# Patient Record
Sex: Male | Born: 1945
Health system: Southern US, Community
[De-identification: ages and names within clinical notes are randomized; demographics above are authoritative.]

## PROBLEM LIST (undated history)

## (undated) DIAGNOSIS — F329 Major depressive disorder, single episode, unspecified: Secondary | ICD-10-CM

## (undated) DIAGNOSIS — F32A Depression, unspecified: Secondary | ICD-10-CM

## (undated) DIAGNOSIS — Z8673 Personal history of transient ischemic attack (TIA), and cerebral infarction without residual deficits: Secondary | ICD-10-CM

## (undated) DIAGNOSIS — Z8619 Personal history of other infectious and parasitic diseases: Secondary | ICD-10-CM

## (undated) DIAGNOSIS — I639 Cerebral infarction, unspecified: Secondary | ICD-10-CM

## (undated) DIAGNOSIS — N529 Male erectile dysfunction, unspecified: Secondary | ICD-10-CM

## (undated) HISTORY — PX: CARDIAC SURGERY: SHX584

## (undated) HISTORY — DX: Major depressive disorder, single episode, unspecified: F32.9

## (undated) HISTORY — PX: SHOULDER SURGERY: SHX246

## (undated) HISTORY — DX: Personal history of transient ischemic attack (TIA), and cerebral infarction without residual deficits: Z86.73

## (undated) HISTORY — DX: Personal history of other infectious and parasitic diseases: Z86.19

## (undated) HISTORY — DX: Cerebral infarction, unspecified: I63.9

## (undated) HISTORY — DX: Depression, unspecified: F32.A

## (undated) HISTORY — DX: Male erectile dysfunction, unspecified: N52.9

---

## 2001-03-02 HISTORY — PX: CORONARY ARTERY BYPASS GRAFT: SHX141

## 2005-09-06 DIAGNOSIS — Z8673 Personal history of transient ischemic attack (TIA), and cerebral infarction without residual deficits: Secondary | ICD-10-CM

## 2005-09-06 HISTORY — DX: Personal history of transient ischemic attack (TIA), and cerebral infarction without residual deficits: Z86.73

## 2008-01-05 HISTORY — PX: COLONOSCOPY: SHX174

## 2008-06-13 ENCOUNTER — Ambulatory Visit: Payer: Self-pay | Admitting: Unknown Physician Specialty

## 2008-07-03 LAB — HM COLONOSCOPY

## 2009-07-23 ENCOUNTER — Ambulatory Visit: Payer: Self-pay | Admitting: Specialist

## 2009-08-06 ENCOUNTER — Ambulatory Visit: Payer: Self-pay | Admitting: Specialist

## 2011-05-20 ENCOUNTER — Observation Stay: Payer: Self-pay | Admitting: Cardiology

## 2011-05-20 ENCOUNTER — Ambulatory Visit: Payer: Self-pay | Admitting: Cardiology

## 2013-08-10 ENCOUNTER — Ambulatory Visit: Payer: Self-pay | Admitting: Podiatry

## 2013-09-07 ENCOUNTER — Encounter: Payer: Self-pay | Admitting: Podiatry

## 2013-09-11 ENCOUNTER — Encounter: Payer: Self-pay | Admitting: Podiatry

## 2013-09-11 ENCOUNTER — Ambulatory Visit (INDEPENDENT_AMBULATORY_CARE_PROVIDER_SITE_OTHER): Payer: Medicare Other | Admitting: Podiatry

## 2013-09-11 VITALS — BP 154/95 | HR 62 | Resp 16 | Ht 68.0 in | Wt 188.0 lb

## 2013-09-11 DIAGNOSIS — L600 Ingrowing nail: Secondary | ICD-10-CM

## 2013-09-11 NOTE — Progress Notes (Signed)
Subjective:     Patient ID: Timothy Carson, male   DOB: 11-20-1945, 68 y.o.   MRN: [id number]  HPI patient presents with painful ingrown toenails of his big toes both feet that have been there for a long time with also damage noted to the nailbeds themselves secondary to golf and physical activity. States they have bothered him worse over the last 6 months   Review of Systems  All other systems reviewed and are negative.       Objective:   Physical Exam  Nursing note and vitals reviewed. Constitutional: He is oriented to person, place, and time. He appears well-nourished.  Cardiovascular: Intact distal pulses.   Musculoskeletal: Normal range of motion.  Neurological: He is oriented to person, place, and time.  Skin: Skin is warm.   neurovascular status intact with normal muscle strength and damaged big toenails both feet with incurvation of the lateral borders and pain when pressed.     Assessment:     Chronic ingrown toenail deformity hallux both feet with damage to the entire nailbeds of both feet    Plan:     H&P performed and recommendations for removal of the lateral corners with possibility long-term we may need to remove the entire nailbeds. Patient wants procedure understanding risks and today I infiltrated 60 mg Xylocaine Marcaine mixture remove the lateral borders exposed the matrix applied 3 applications of phenol 30 seconds followed by alcohol lavaged and sterile dressing. Instructed on soaks for patient and reappoint as needed

## 2013-09-11 NOTE — Patient Instructions (Signed)

## 2013-09-11 NOTE — Progress Notes (Signed)
   Subjective:    Patient ID: Timothy Carson, male    DOB: 1945-11-27, 68 y.o.   MRN: [id number]  HPI Comments: Ingrown toenails ,both great toenails lateral corners are painful , play golf and walk a lot and that seems to aggravate it , also touching the sheets at night seem to be painful .     Review of Systems  Musculoskeletal: Positive for back pain.  All other systems reviewed and are negative.       Objective:   Physical Exam        Assessment & Plan:

## 2014-01-07 LAB — PSA: PSA: 0.6

## 2014-05-28 DIAGNOSIS — I25810 Atherosclerosis of coronary artery bypass graft(s) without angina pectoris: Secondary | ICD-10-CM | POA: Insufficient documentation

## 2014-05-28 DIAGNOSIS — E785 Hyperlipidemia, unspecified: Secondary | ICD-10-CM | POA: Insufficient documentation

## 2014-11-11 DIAGNOSIS — I2510 Atherosclerotic heart disease of native coronary artery without angina pectoris: Secondary | ICD-10-CM | POA: Diagnosis not present

## 2014-11-11 DIAGNOSIS — I10 Essential (primary) hypertension: Secondary | ICD-10-CM | POA: Diagnosis not present

## 2014-11-11 DIAGNOSIS — E780 Pure hypercholesterolemia: Secondary | ICD-10-CM | POA: Diagnosis not present

## 2014-11-11 DIAGNOSIS — I25810 Atherosclerosis of coronary artery bypass graft(s) without angina pectoris: Secondary | ICD-10-CM | POA: Diagnosis not present

## 2014-11-14 DIAGNOSIS — M7652 Patellar tendinitis, left knee: Secondary | ICD-10-CM | POA: Diagnosis not present

## 2015-01-10 DIAGNOSIS — Z0000 Encounter for general adult medical examination without abnormal findings: Secondary | ICD-10-CM | POA: Diagnosis not present

## 2015-01-13 DIAGNOSIS — Z0000 Encounter for general adult medical examination without abnormal findings: Secondary | ICD-10-CM | POA: Diagnosis not present

## 2015-01-13 DIAGNOSIS — I2510 Atherosclerotic heart disease of native coronary artery without angina pectoris: Secondary | ICD-10-CM | POA: Diagnosis not present

## 2015-01-13 DIAGNOSIS — N529 Male erectile dysfunction, unspecified: Secondary | ICD-10-CM | POA: Diagnosis not present

## 2015-01-13 LAB — TSH: TSH: 5.26 u[IU]/mL (ref 0.41–5.90)

## 2015-01-13 LAB — BASIC METABOLIC PANEL
BUN: 14 mg/dL (ref 4–21)
Creatinine: 1 mg/dL (ref 0.6–1.3)
GLUCOSE: 135 mg/dL
Potassium: 4.3 mmol/L (ref 3.4–5.3)
Sodium: 144 mmol/L (ref 137–147)

## 2015-01-13 LAB — LIPID PANEL
Cholesterol: 123 mg/dL (ref 0–200)
HDL: 57 mg/dL (ref 35–70)
LDL CALC: 47 mg/dL
Triglycerides: 93 mg/dL (ref 40–160)

## 2015-06-18 ENCOUNTER — Other Ambulatory Visit (INDEPENDENT_AMBULATORY_CARE_PROVIDER_SITE_OTHER): Payer: Medicare Other | Admitting: Family Medicine

## 2015-06-18 ENCOUNTER — Ambulatory Visit (INDEPENDENT_AMBULATORY_CARE_PROVIDER_SITE_OTHER): Payer: Medicare Other

## 2015-06-18 DIAGNOSIS — Z23 Encounter for immunization: Secondary | ICD-10-CM | POA: Diagnosis not present

## 2015-11-18 DIAGNOSIS — I2510 Atherosclerotic heart disease of native coronary artery without angina pectoris: Secondary | ICD-10-CM | POA: Insufficient documentation

## 2015-11-18 DIAGNOSIS — F329 Major depressive disorder, single episode, unspecified: Secondary | ICD-10-CM | POA: Insufficient documentation

## 2015-11-18 DIAGNOSIS — Z8673 Personal history of transient ischemic attack (TIA), and cerebral infarction without residual deficits: Secondary | ICD-10-CM | POA: Insufficient documentation

## 2015-11-18 DIAGNOSIS — F32A Depression, unspecified: Secondary | ICD-10-CM | POA: Insufficient documentation

## 2015-11-18 DIAGNOSIS — N529 Male erectile dysfunction, unspecified: Secondary | ICD-10-CM | POA: Insufficient documentation

## 2015-11-18 DIAGNOSIS — Z8619 Personal history of other infectious and parasitic diseases: Secondary | ICD-10-CM | POA: Insufficient documentation

## 2015-11-19 ENCOUNTER — Encounter: Payer: Medicare Other | Admitting: Family Medicine

## 2015-11-20 ENCOUNTER — Ambulatory Visit (INDEPENDENT_AMBULATORY_CARE_PROVIDER_SITE_OTHER): Payer: Medicare Other | Admitting: Family Medicine

## 2015-11-20 ENCOUNTER — Encounter: Payer: Self-pay | Admitting: Family Medicine

## 2015-11-20 VITALS — BP 120/70 | HR 55 | Temp 97.6°F | Resp 16 | Ht 67.5 in | Wt 170.0 lb

## 2015-11-20 DIAGNOSIS — Z0000 Encounter for general adult medical examination without abnormal findings: Secondary | ICD-10-CM | POA: Diagnosis not present

## 2015-11-20 DIAGNOSIS — E785 Hyperlipidemia, unspecified: Secondary | ICD-10-CM

## 2015-11-20 DIAGNOSIS — I2510 Atherosclerotic heart disease of native coronary artery without angina pectoris: Secondary | ICD-10-CM

## 2015-11-20 DIAGNOSIS — Z125 Encounter for screening for malignant neoplasm of prostate: Secondary | ICD-10-CM

## 2015-11-20 DIAGNOSIS — R739 Hyperglycemia, unspecified: Secondary | ICD-10-CM | POA: Diagnosis not present

## 2015-11-20 NOTE — Progress Notes (Signed)
Patient: Timothy Carson, Male    DOB: Feb 20, 1946, 70 y.o.   MRN: [id number] Visit Date: 11/20/2015  Today's Provider: Lelon Huh, MD   Chief Complaint  Patient presents with  . Annual Exam  . Coronary Artery Disease  . Hyperglycemia   Subjective:    Annual physical  Timothy Carson is a 70 y.o. male. He feels well. He reports exercising yes/walking. He reports he is sleeping fairly well.  -----------------------------------------------------------   Follow-up for CAD from 01/10/2015; labs, no changes. Follow-up for hyperglycemia from 01/10/2015; labs showed high blood sugar at 135. Patient advised to cut back on sweets and starchy foods. Also advised to exercise daily. He feels well with no complaints today. Exercises every day. On low salt diet. Compliant with medications.   Review of Systems  Constitutional: Negative.   HENT: Positive for tinnitus.   Eyes: Negative.   Respiratory: Negative.   Cardiovascular: Positive for chest pain.  Gastrointestinal: Positive for constipation.  Endocrine: Negative.   Genitourinary: Negative.   Musculoskeletal: Negative.   Skin: Negative.   Allergic/Immunologic: Negative.   Neurological: Negative.   Hematological: Negative.   Psychiatric/Behavioral: Negative.     Social History   Social History  . Marital Status: Married    Spouse Name: N/A  . Number of Children: N/A  . Years of Education: N/A   Occupational History  . Retired    Social History Main Topics  . Smoking status: Never Smoker   . Smokeless tobacco: Never Used  . Alcohol Use: No  . Drug Use: No  . Sexual Activity: Not on file   Other Topics Concern  . Not on file   Social History Narrative    Past Medical History  Diagnosis Date  . History of TIA (transient ischemic attack)   . Depression   . ED (erectile dysfunction)   . History of shingles   . History of measles   . History of mumps      Patient Active Problem List   Diagnosis Date Noted    . History of TIA (transient ischemic attack) 11/18/2015  . Depression 11/18/2015  . CAD (coronary artery disease) 11/18/2015  . ED (erectile dysfunction) of organic origin 11/18/2015  . HLD (hyperlipidemia) 05/28/2014    Past Surgical History  Procedure Laterality Date  . Cardiac surgery    . Shoulder surgery Bilateral   . Coronary artery bypass graft  03/02/2001    Two:  LIMA [facility] LAD to [facility]  . Colonoscopy  01/2008    No polyps; Diverticulosis    His family history includes Heart disease in his mother.    Previous Medications   ASPIRIN 81 MG TABLET    Take 81 mg by mouth daily.   EZETIMIBE-SIMVASTATIN (VYTORIN) 10-20 MG PER TABLET    Take 1 tablet by mouth daily.   SILDENAFIL (VIAGRA) 100 MG TABLET    Take 0.5-1 tablets by mouth as needed.    Patient Care Team: Birdie Sons, MD as PCP - General (Family Medicine) Isaias Cowman, MD as Consulting Physician (Cardiology)     Objective:   Vitals: BP 120/70 mmHg  Pulse 55  Temp(Src) 97.6 F (36.4 C) (Oral)  Resp 16  Ht 5' 7.5" (1.715 m)  Wt 170 lb (77.111 kg)  BMI 26.22 kg/m2  SpO2 100%  Physical Exam   General Appearance:    Alert, cooperative, no distress, appears stated age  Head:    Normocephalic, without obvious abnormality, atraumatic  Eyes:    PERRL, conjunctiva/corneas clear, EOM's intact, fundi    benign, both eyes       Ears:    Normal TM's and external ear canals, both ears  Nose:   Nares normal, septum midline, mucosa normal, no drainage   or sinus tenderness  Throat:   Lips, mucosa, and tongue normal; teeth and gums normal  Neck:   Supple, symmetrical, trachea midline, no adenopathy;       thyroid:  No enlargement/tenderness/nodules; no carotid   bruit or JVD  Back:     Symmetric, no curvature, ROM normal, no CVA tenderness  Lungs:     Clear to auscultation bilaterally, respirations unlabored  Chest wall:    No tenderness or deformity. Well healed CABG scar  Heart:    Regular rate and rhythm,  S1 and S2 normal, no murmur, rub   or gallop  Abdomen:     Soft, non-tender, bowel sounds active all four quadrants,    no masses, no organomegaly  Genitalia:    deferred  Rectal:    deferred  Extremities:   Extremities normal, atraumatic, no cyanosis or edema  Pulses:   2+ and symmetric all extremities  Skin:   Skin color, texture, turgor normal, no rashes or lesions  Lymph nodes:   Cervical, supraclavicular, and axillary nodes normal  Neurologic:   CNII-XII intact. Normal strength, sensation and reflexes      throughout    Activities of Daily Living In your present state of health, do you have any difficulty performing the following activities: 11/20/2015  Hearing? N  Vision? N  Difficulty concentrating or making decisions? N  Walking or climbing stairs? N  Dressing or bathing? N  Doing errands, shopping? N    Fall Risk Assessment Fall Risk  11/20/2015  Falls in the past year? No     Depression Screen PHQ 2/9 Scores 11/20/2015  PHQ - 2 Score 0    Cognitive Testing - 6-CIT  Correct? Score   What year is it? yes 0 0 or 4  What month is it? yes 0 0 or 3  Memorize:    Timothy Carson,  [birth year],  [place],      What time is it? (within 1 hour) yes 0 0 or 3  Count backwards from 20 yes 0 0, 2, or 4  Name the months of the year yes 0 0, 2, or 4  Repeat name & address above yes 3 0, 2, 4, 6, 8, or 10       TOTAL SCORE  3/28   Interpretation:  Normal  Normal (0-7) Abnormal (8-28)    Audit-C Alcohol Use Screening  Question Answer Points  How often do you have alcoholic drink? never 0  On days you do drink alcohol, how many drinks do you typically consume? 1 or 2 0  How oftey will you drink 6 or more in a total? never 0  Total Score:  0   A score of 3 or more in women, and 4 or more in men indicates increased risk for alcohol abuse, EXCEPT if all of the points are from question 1.      Assessment & Plan:     Annual Wellness Visit  Reviewed patient's Family  Medical History Reviewed and updated list of patient's medical providers Assessment of cognitive impairment was done Assessed patient's functional ability Established a written schedule for health screening [place] Completed and Reviewed  Exercise Activities  and Dietary recommendations Goals    None      Immunization History  Administered Date(s) Administered  . Influenza, High Dose Seasonal PF 06/18/2015  . Pneumococcal Conjugate-13 06/18/2015  . Pneumococcal Polysaccharide-23 05/30/2013  . Tdap 08/17/2011  . Zoster 05/30/2013    Health Maintenance  Topic Date Due  . Hepatitis C Screening  03-21-1946  . COLONOSCOPY  11/27/1996  . INFLUENZA VACCINE  04/06/2016  . TETANUS/TDAP  08/16/2021  . ZOSTAVAX  Completed  . PNA vac Low Risk Adult  Completed      Discussed health benefits of physical activity, and encouraged him to engage in regular exercise appropriate for his age and condition.    --------------------------------------------------------------------------------  1. Annual physical exam Doing well  - Comprehensive metabolic panel  2. Coronary artery disease involving native coronary artery of native heart without angina pectoris Asymptomatic. Compliant with medication.  Continue aggressive risk factor modification.  Continue biannual follow up with Dr. Saralyn Pilar  3. HLD (hyperlipidemia) Doing well with Vytorin - Lipid panel  4. Hyperglycemia  - Hemoglobin A1c - Comprehensive metabolic panel  5. Prostate cancer screening  - PSA

## 2015-11-20 NOTE — Patient Instructions (Signed)
   Please contact your eyecare professional to schedule a routine eye exam  

## 2015-11-21 ENCOUNTER — Telehealth: Payer: Self-pay | Admitting: Family Medicine

## 2015-11-21 LAB — COMPREHENSIVE METABOLIC PANEL
A/G RATIO: 1.7 (ref 1.2–2.2)
ALK PHOS: 59 IU/L (ref 39–117)
ALT: 20 IU/L (ref 0–44)
AST: 23 IU/L (ref 0–40)
Albumin: 4.2 g/dL (ref 3.6–4.8)
BUN / CREAT RATIO: 13 (ref 10–22)
BUN: 13 mg/dL (ref 8–27)
Bilirubin Total: 1 mg/dL (ref 0.0–1.2)
CO2: 23 mmol/L (ref 18–29)
Calcium: 9.6 mg/dL (ref 8.6–10.2)
Chloride: 107 mmol/L — ABNORMAL HIGH (ref 96–106)
Creatinine, Ser: 0.98 mg/dL (ref 0.76–1.27)
GFR calc Af Amer: 91 mL/min/{1.73_m2} (ref >59)
GFR calc non Af Amer: 79 mL/min/{1.73_m2} (ref >59)
GLOBULIN, TOTAL: 2.5 g/dL (ref 1.5–4.5)
Glucose: 116 mg/dL — ABNORMAL HIGH (ref 65–99)
POTASSIUM: 4.9 mmol/L (ref 3.5–5.2)
SODIUM: 146 mmol/L — AB (ref 134–144)
Total Protein: 6.7 g/dL (ref 6.0–8.5)

## 2015-11-21 LAB — HEMOGLOBIN A1C
Est. average glucose Bld gHb Est-mCnc: 123 mg/dL
Hgb A1c MFr Bld: 5.9 % — ABNORMAL HIGH (ref 4.8–5.6)

## 2015-11-21 LAB — LIPID PANEL
CHOLESTEROL TOTAL: 122 mg/dL (ref 100–199)
Chol/HDL Ratio: 2.1 ratio units (ref 0.0–5.0)
HDL: 59 mg/dL (ref >39)
LDL CALC: 47 mg/dL (ref 0–99)
TRIGLYCERIDES: 78 mg/dL (ref 0–149)
VLDL CHOLESTEROL CAL: 16 mg/dL (ref 5–40)

## 2015-11-21 LAB — PSA: Prostate Specific Ag, Serum: 0.7 ng/mL (ref 0.0–4.0)

## 2015-11-21 NOTE — Telephone Encounter (Signed)
Pt is returning call.  [phone]

## 2015-11-21 NOTE — Telephone Encounter (Signed)
Pt notified of results

## 2015-11-21 NOTE — Progress Notes (Signed)
LMTCB

## 2016-01-06 DIAGNOSIS — I2510 Atherosclerotic heart disease of native coronary artery without angina pectoris: Secondary | ICD-10-CM | POA: Diagnosis not present

## 2016-01-06 DIAGNOSIS — Z8673 Personal history of transient ischemic attack (TIA), and cerebral infarction without residual deficits: Secondary | ICD-10-CM | POA: Diagnosis not present

## 2016-01-06 DIAGNOSIS — I25810 Atherosclerosis of coronary artery bypass graft(s) without angina pectoris: Secondary | ICD-10-CM | POA: Diagnosis not present

## 2016-01-06 DIAGNOSIS — I10 Essential (primary) hypertension: Secondary | ICD-10-CM | POA: Diagnosis not present

## 2016-01-06 DIAGNOSIS — E7800 Pure hypercholesterolemia, unspecified: Secondary | ICD-10-CM | POA: Diagnosis not present

## 2016-03-06 DIAGNOSIS — I639 Cerebral infarction, unspecified: Secondary | ICD-10-CM

## 2016-03-06 HISTORY — DX: Cerebral infarction, unspecified: I63.9

## 2016-03-24 DIAGNOSIS — I781 Nevus, non-neoplastic: Secondary | ICD-10-CM | POA: Diagnosis not present

## 2016-03-24 DIAGNOSIS — L821 Other seborrheic keratosis: Secondary | ICD-10-CM | POA: Diagnosis not present

## 2016-03-24 DIAGNOSIS — L570 Actinic keratosis: Secondary | ICD-10-CM | POA: Diagnosis not present

## 2016-05-06 ENCOUNTER — Telehealth: Payer: Self-pay | Admitting: Family Medicine

## 2016-05-06 NOTE — Telephone Encounter (Signed)
Pt needs a letter stating his health is good enough to participate in the activities that they offer at the [facility]  Hiking, Kayaking ect.  He will come pick it up when it is ready.  Thanks Con Memos

## 2016-05-07 ENCOUNTER — Encounter: Payer: Self-pay | Admitting: Family Medicine

## 2016-05-07 NOTE — Progress Notes (Signed)
[facility]

## 2016-05-07 NOTE — Telephone Encounter (Signed)
Patient notified letter is ready to pick up.

## 2016-06-24 ENCOUNTER — Ambulatory Visit (INDEPENDENT_AMBULATORY_CARE_PROVIDER_SITE_OTHER): Payer: Medicare Other

## 2016-06-24 DIAGNOSIS — Z23 Encounter for immunization: Secondary | ICD-10-CM

## 2016-07-16 ENCOUNTER — Telehealth: Payer: Self-pay | Admitting: Family Medicine

## 2016-07-16 NOTE — Telephone Encounter (Signed)
Pt returned call and stated that he will call back in 2018 to schedule the appt. Thanks TNP

## 2016-07-16 NOTE — Telephone Encounter (Signed)
Called Pt to schedule AWV with [facility] and CPE with Dr. Caryn Section for March 2018 - knb

## 2016-08-18 DIAGNOSIS — I10 Essential (primary) hypertension: Secondary | ICD-10-CM | POA: Diagnosis not present

## 2016-08-18 DIAGNOSIS — E7800 Pure hypercholesterolemia, unspecified: Secondary | ICD-10-CM | POA: Diagnosis not present

## 2016-08-18 DIAGNOSIS — I25810 Atherosclerosis of coronary artery bypass graft(s) without angina pectoris: Secondary | ICD-10-CM | POA: Diagnosis not present

## 2016-08-18 DIAGNOSIS — Z8673 Personal history of transient ischemic attack (TIA), and cerebral infarction without residual deficits: Secondary | ICD-10-CM | POA: Diagnosis not present

## 2016-08-18 DIAGNOSIS — I2510 Atherosclerotic heart disease of native coronary artery without angina pectoris: Secondary | ICD-10-CM | POA: Diagnosis not present

## 2016-08-18 DIAGNOSIS — R739 Hyperglycemia, unspecified: Secondary | ICD-10-CM | POA: Diagnosis not present

## 2016-08-23 DIAGNOSIS — I441 Atrioventricular block, second degree: Secondary | ICD-10-CM | POA: Diagnosis not present

## 2016-09-15 DIAGNOSIS — I2510 Atherosclerotic heart disease of native coronary artery without angina pectoris: Secondary | ICD-10-CM | POA: Diagnosis not present

## 2016-09-15 DIAGNOSIS — I25810 Atherosclerosis of coronary artery bypass graft(s) without angina pectoris: Secondary | ICD-10-CM | POA: Diagnosis not present

## 2016-09-15 DIAGNOSIS — Z8673 Personal history of transient ischemic attack (TIA), and cerebral infarction without residual deficits: Secondary | ICD-10-CM | POA: Diagnosis not present

## 2016-09-15 DIAGNOSIS — I6523 Occlusion and stenosis of bilateral carotid arteries: Secondary | ICD-10-CM | POA: Diagnosis not present

## 2016-09-16 ENCOUNTER — Ambulatory Visit (INDEPENDENT_AMBULATORY_CARE_PROVIDER_SITE_OTHER): Payer: Medicare Other | Admitting: Podiatry

## 2016-09-16 ENCOUNTER — Encounter: Payer: Self-pay | Admitting: Podiatry

## 2016-09-16 DIAGNOSIS — M792 Neuralgia and neuritis, unspecified: Secondary | ICD-10-CM | POA: Diagnosis not present

## 2016-09-16 DIAGNOSIS — B351 Tinea unguium: Secondary | ICD-10-CM

## 2016-09-16 DIAGNOSIS — L608 Other nail disorders: Secondary | ICD-10-CM | POA: Diagnosis not present

## 2016-09-16 DIAGNOSIS — L603 Nail dystrophy: Secondary | ICD-10-CM

## 2016-09-16 NOTE — Progress Notes (Signed)
Subjective: 71 year old male presents the office today for concerns of his right big toenail becoming very thick after he had an ingrown toenail procedure done 2 years ago. He states that several weeks after procedure history is more thickening to the toenail. He's had no treatment of the last couple years and he is just on the toenail grow. He denies any pain with the toenail denies any redness or drainage or any swelling. He also states that on the right second toe he does that he started to get a toenail fungus to the nails well. Again denies any pain or swelling. He also states that his left foot he gets some occasional numbness to the top of his foot when he wears certain shoes as his compressing on the top of the foot. He is in no recent treatment for this. It is only with walking and wearing shoes. No recent injury. Denies any systemic complaints such as fevers, chills, nausea, vomiting. No acute changes since last appointment, and no other complaints at this time.   ROS:  All other systems reviewed and negative   Objective: AAO x3, NAD DP/PT pulses palpable bilaterally, CRT less than 3 seconds Right hallux toenail significantly hypertrophic, dystrophic, brittle, discolored with yellow to brown discoloration. The left hallux toenail somewhat dystrophic, discolored and minimally hypertrophic. There is slight yellow discoloration of the right second digit toenail. There is no edema, erythema, drainage or pus coming from the toenails there is no signs of infection. There is no area pinpoint tenderness or pain the vibratory sensation. There is negative Tinel sign and superficial peroneal nerves into the nerves the left foot.  No open lesions or pre-ulcerative lesions.  No pain with calf compression, swelling, warmth, erythema  Assessment: Left foot neuritis, right hallux onychodystrophy likely onychomycosis as well as right 2nd  Plan: -All treatment options discussed with the patient including  all alternatives, risks, complications.  -I discussed releasing the left to help the nerve pain. Discussed that as this goes on longer can be more permanent. If replacement she is not help recommend follow-up. -Right hallux toenail  was debrided today without complications and this was sent to [facility] for  evaluation of nail fungus. -Recommended Urea cream for now we will likely start nail fungus treatment pending culture. Once we start this he can do this on the other toenails as well due to possible early onychomycosis. -Follow-up after nail culture or sooner if needed.  Celesta Gentile, DPM

## 2016-09-16 NOTE — Addendum Note (Signed)
Addended by: Cranford Mon R on: 09/16/2016 09:23 AM   Modules accepted: Orders

## 2016-09-21 DIAGNOSIS — I10 Essential (primary) hypertension: Secondary | ICD-10-CM | POA: Diagnosis not present

## 2016-09-21 DIAGNOSIS — Z8673 Personal history of transient ischemic attack (TIA), and cerebral infarction without residual deficits: Secondary | ICD-10-CM | POA: Diagnosis not present

## 2016-09-21 DIAGNOSIS — E7800 Pure hypercholesterolemia, unspecified: Secondary | ICD-10-CM | POA: Diagnosis not present

## 2016-09-21 DIAGNOSIS — I25810 Atherosclerosis of coronary artery bypass graft(s) without angina pectoris: Secondary | ICD-10-CM | POA: Diagnosis not present

## 2016-09-27 ENCOUNTER — Encounter: Payer: Self-pay | Admitting: Family Medicine

## 2016-09-27 ENCOUNTER — Ambulatory Visit (INDEPENDENT_AMBULATORY_CARE_PROVIDER_SITE_OTHER): Payer: Medicare Other | Admitting: Family Medicine

## 2016-09-27 VITALS — BP 120/78 | HR 57 | Temp 98.7°F | Resp 16 | Ht 68.0 in | Wt 164.0 lb

## 2016-09-27 DIAGNOSIS — E785 Hyperlipidemia, unspecified: Secondary | ICD-10-CM

## 2016-09-27 DIAGNOSIS — Z8673 Personal history of transient ischemic attack (TIA), and cerebral infarction without residual deficits: Secondary | ICD-10-CM

## 2016-09-27 DIAGNOSIS — Z1159 Encounter for screening for other viral diseases: Secondary | ICD-10-CM | POA: Diagnosis not present

## 2016-09-27 DIAGNOSIS — I2510 Atherosclerotic heart disease of native coronary artery without angina pectoris: Secondary | ICD-10-CM | POA: Diagnosis not present

## 2016-09-27 DIAGNOSIS — Z0000 Encounter for general adult medical examination without abnormal findings: Secondary | ICD-10-CM | POA: Diagnosis not present

## 2016-09-27 NOTE — Progress Notes (Signed)
Patient: Timothy Carson, Male    DOB: 1946-03-10, 71 y.o.   MRN: [id number] Visit Date: 09/27/2016  Today's Provider: Lelon Huh, MD   Chief Complaint  Patient presents with  . Annual Exam  . Hyperlipidemia  . Hyperglycemia  . Coronary Artery Disease   Subjective:    Annual physical Timothy Carson is a 71 y.o. male. He feels well. He reports exercising yes. He reports he is sleeping well.  -----------------------------------------------------------   Coronary artery disease involving native coronary artery of native heart without angina pectoris From 11/20/2015-Asymptomatic. Continue biannual follow up with Dr. Saralyn Carson.  Hyperglycemia From 11/20/2015-labs checked, no changes   Lipid/Cholesterol, Follow-up:   Last seen for this 10 months ago.  Management since that visit includes; labs checked, recommended patient avoid sweats and starchy foods.  Last Lipid Panel:    Component Value Date/Time   CHOL 122 11/20/2015 0955   TRIG 78 11/20/2015 0955   HDL 59 11/20/2015 0955   CHOLHDL 2.1 11/20/2015 0955   LDLCALC 47 11/20/2015 0955    He reports good compliance with treatment. He is not having side effects. none  Wt Readings from Last 3 Encounters:  09/27/16 164 lb (74.4 kg)  11/20/15 170 lb (77.1 kg)  09/11/13 188 lb (85.3 kg)    ----------------------------------------------------------------  States he had a stroke in July causing some trouble speaking and weak legs. Symptoms last a few weeks and have since completely resolved. He had routine follow up with Timothy Carson a few weeks ago and had normal carotid ultrasound.   Review of Systems  Constitutional: Negative.   HENT: Positive for tinnitus.   Eyes: Negative.   Respiratory: Negative.   Cardiovascular: Negative.   Gastrointestinal: Positive for constipation.  Endocrine: Negative.   Genitourinary: Positive for urgency.  Musculoskeletal:       Foot pain (top of left foot)  Skin: Negative.     Allergic/Immunologic: Negative.   Neurological: Positive for dizziness.  Hematological: Negative.   Psychiatric/Behavioral: Negative.     Social History   Social History  . Marital status: Married    Spouse name: N/A  . Number of children: N/A  . Years of education: N/A   Occupational History  . Retired    Social History Main Topics  . Smoking status: Never Smoker  . Smokeless tobacco: Never Used  . Alcohol use No  . Drug use: No  . Sexual activity: Not on file   Other Topics Concern  . Not on file   Social History Narrative  . No narrative on file    Past Medical History:  Diagnosis Date  . Depression   . ED (erectile dysfunction)   . History of measles   . History of mumps   . History of shingles   . History of TIA (transient ischemic attack) 2007 and 7/17     Patient Active Problem List   Diagnosis Date Noted  . Hyperglycemia 11/20/2015  . History of TIA (transient ischemic attack) 11/18/2015  . CAD (coronary artery disease) 11/18/2015  . ED (erectile dysfunction) of organic origin 11/18/2015  . HLD (hyperlipidemia) 05/28/2014    Past Surgical History:  Procedure Laterality Date  . CARDIAC SURGERY    . COLONOSCOPY  01/2008   No polyps; Diverticulosis  . CORONARY ARTERY BYPASS GRAFT  03/02/2001   Two:  LIMA to LAD to [facility]  . SHOULDER SURGERY Bilateral     His family history includes Heart disease in his mother.  Current Outpatient Prescriptions:  .  aspirin 81 MG tablet, Take 81 mg by mouth daily., Disp: , Rfl:  .  ezetimibe-simvastatin (VYTORIN) 10-20 MG per tablet, Take 1 tablet by mouth daily., Disp: , Rfl:  .  sildenafil (VIAGRA) 100 MG tablet, Take 0.5-1 tablets by mouth as needed., Disp: , Rfl:   Patient Care Team: Timothy Sons, MD as PCP - General (Family Medicine) Timothy Cowman, MD as Consulting Physician (Cardiology)     Objective:   Vitals: BP 120/78 (BP Location: Left Arm, Patient Position: Sitting, Cuff Size:  Large)   Pulse (!) 57   Temp 98.7 F (37.1 C) (Oral)   Resp 16   Ht 5\' 8"  (1.727 m)   Wt 164 lb (74.4 kg)   SpO2 97%   BMI 24.94 kg/m   Physical Exam   General Appearance:    Alert, cooperative, no distress, appears stated age  Head:    Normocephalic, without obvious abnormality, atraumatic  Eyes:    PERRL, conjunctiva/corneas clear, EOM's intact, fundi    benign, both eyes       Ears:    Normal TM's and external ear canals, both ears  Nose:   Nares normal, septum midline, mucosa normal, no drainage   or sinus tenderness  Throat:   Lips, mucosa, and tongue normal; teeth and gums normal  Neck:   Supple, symmetrical, trachea midline, no adenopathy;       thyroid:  No enlargement/tenderness/nodules; no carotid   bruit or JVD  Back:     Symmetric, no curvature, ROM normal, no CVA tenderness  Lungs:     Clear to auscultation bilaterally, respirations unlabored  Chest wall:    No tenderness or deformity  Heart:    Regular rate and rhythm, S1 and S2 normal, no murmur, rub   or gallop  Abdomen:     Soft, non-tender, bowel sounds active all four quadrants,    no masses, no organomegaly  Genitalia:    deferred  Rectal:    deferred  Extremities:   Extremities normal, atraumatic, no cyanosis or edema  Pulses:   2+ and symmetric all extremities  Skin:   Skin color, texture, turgor normal, no rashes or lesions  Lymph nodes:   Cervical, supraclavicular, and axillary nodes normal  Neurologic:   CNII-XII intact. Normal strength, sensation and reflexes      throughout     Activities of Daily Living In your present state of health, do you have any difficulty performing the following activities: 09/27/2016 11/20/2015  Hearing? N N  Vision? N N  Difficulty concentrating or making decisions? N N  Walking or climbing stairs? N N  Dressing or bathing? N N  Doing errands, shopping? N N  Some recent data might be hidden    Fall Risk Assessment Fall Risk  09/27/2016 11/20/2015  Falls in the  past year? No No     Depression Screen PHQ 2/9 Scores 09/27/2016 11/20/2015  PHQ - 2 Score 0 0    Cognitive Testing - 6-CIT  Correct? Score   What year is it? yes 0 0 or 4  What month is it? yes 0 0 or 3  Memorize:    Timothy Carson,  [birth year],  Timothy Carson,      What time is it? (within 1 hour) yes 0 0 or 3  Count backwards from 20 yes 0 0, 2, or 4  Name the months of the year yes 0 0, 2, or 4  Repeat name & address above yes 0 0, 2, 4, 6, 8, or 10       TOTAL SCORE  0/28   Interpretation:  Normal  Normal (0-7) Abnormal (8-28)    Audit-C Alcohol Use Screening  Question Answer Points  How often do you have alcoholic drink? never 0  On days you do drink alcohol, how many drinks do you typically consume? n/a 0  How oftey will you drink 6 or more in a total? never 0  Total Score:  0   A score of 3 or more in women, and 4 or more in men indicates increased risk for alcohol abuse, EXCEPT if all of the points are from question 1.     Assessment & Plan:    Annual Physical Reviewed patient's Family Medical History Reviewed and updated list of patient's medical providers Assessment of cognitive impairment was done Assessed patient's functional ability Established a written schedule for health screening [place] Completed and Reviewed  Exercise Activities and Dietary recommendations Goals    None      Immunization History  Administered Date(s) Administered  . Influenza, High Dose Seasonal PF 06/18/2015, 06/24/2016  . Pneumococcal Conjugate-13 06/18/2015  . Pneumococcal Polysaccharide-23 05/30/2013  . Tdap 08/17/2011  . Zoster 05/30/2013    Health Maintenance  Topic Date Due  . Hepatitis C Screening  [birth year]  . COLONOSCOPY  11/27/1996  . TETANUS/TDAP  08/16/2021  . INFLUENZA VACCINE  Completed  . ZOSTAVAX  Completed  . PNA vac Low Risk Adult  Completed     Discussed health benefits of physical activity, and encouraged him to engage in  regular exercise appropriate for his age and condition.    --------------------------------------------------------------------------  1. Annual physical exam   2. Hyperlipidemia, unspecified hyperlipidemia type Doing well on Vytorin - Lipid panel - Hepatic function panel - TSH  3. TIA/CVA Is scheduled to see Dr. Manuella Ghazi for follow up in near future.   4. Coronary artery disease involving native coronary artery of native heart without angina pectoris Asymptomatic. Compliant with medication.  Continue aggressive risk factor modification.  Continue routine follow up Dr. Saralyn Carson.  - Renal function panel  5. Need for hepatitis C screening test  - Hepatitis C antibody   The entirety of the information documented in the History of Present Illness, Review of Systems and Physical Exam were personally obtained by me. Portions of this information were initially documented by April M. Sabra Heck, CMA and reviewed by me for thoroughness and accuracy.    Timothy Huh, MD  [facility]

## 2016-09-28 DIAGNOSIS — Z1159 Encounter for screening for other viral diseases: Secondary | ICD-10-CM | POA: Diagnosis not present

## 2016-09-28 DIAGNOSIS — I2510 Atherosclerotic heart disease of native coronary artery without angina pectoris: Secondary | ICD-10-CM | POA: Diagnosis not present

## 2016-09-28 DIAGNOSIS — E785 Hyperlipidemia, unspecified: Secondary | ICD-10-CM | POA: Diagnosis not present

## 2016-09-29 ENCOUNTER — Telehealth: Payer: Self-pay

## 2016-09-29 DIAGNOSIS — R471 Dysarthria and anarthria: Secondary | ICD-10-CM | POA: Diagnosis not present

## 2016-09-29 DIAGNOSIS — Z8673 Personal history of transient ischemic attack (TIA), and cerebral infarction without residual deficits: Secondary | ICD-10-CM | POA: Diagnosis not present

## 2016-09-29 LAB — RENAL FUNCTION PANEL
ALBUMIN: 4.2 g/dL (ref 3.6–4.8)
BUN/Creatinine Ratio: 14 (ref 10–24)
BUN: 14 mg/dL (ref 8–27)
CHLORIDE: 106 mmol/L (ref 96–106)
CO2: 24 mmol/L (ref 18–29)
Calcium: 9.2 mg/dL (ref 8.6–10.2)
Creatinine, Ser: 1.03 mg/dL (ref 0.76–1.27)
GFR calc non Af Amer: 74 mL/min/{1.73_m2} (ref >59)
GFR, EST AFRICAN AMERICAN: 85 mL/min/{1.73_m2} (ref >59)
GLUCOSE: 117 mg/dL — AB (ref 65–99)
PHOSPHORUS: 2.7 mg/dL (ref 2.5–4.5)
POTASSIUM: 4.2 mmol/L (ref 3.5–5.2)
Sodium: 143 mmol/L (ref 134–144)

## 2016-09-29 LAB — TSH: TSH: 8.03 u[IU]/mL — ABNORMAL HIGH (ref 0.450–4.500)

## 2016-09-29 LAB — HEPATITIS C ANTIBODY

## 2016-09-29 LAB — LIPID PANEL
CHOLESTEROL TOTAL: 131 mg/dL (ref 100–199)
Chol/HDL Ratio: 2.3 ratio units (ref 0.0–5.0)
HDL: 57 mg/dL (ref >39)
LDL Calculated: 57 mg/dL (ref 0–99)
TRIGLYCERIDES: 85 mg/dL (ref 0–149)
VLDL CHOLESTEROL CAL: 17 mg/dL (ref 5–40)

## 2016-09-29 LAB — HEPATIC FUNCTION PANEL
ALT: 32 IU/L (ref 0–44)
AST: 25 IU/L (ref 0–40)
Alkaline Phosphatase: 68 IU/L (ref 39–117)
Bilirubin Total: 1 mg/dL (ref 0.0–1.2)
Bilirubin, Direct: 0.25 mg/dL (ref 0.00–0.40)
TOTAL PROTEIN: 6.8 g/dL (ref 6.0–8.5)

## 2016-09-29 NOTE — Telephone Encounter (Signed)
Advised patient of results.  

## 2016-09-29 NOTE — Telephone Encounter (Signed)
Left message to call back  

## 2016-09-29 NOTE — Telephone Encounter (Signed)
-----   Message from Birdie Sons, MD sent at 09/29/2016  8:08 AM EST ----- Cholesterol is well controlled is a little hyPOthyroid. Thyroid levels can fluctuate. Recommend recheck T4 and TSH in a month to see if he may need thyroid medications. Rest of labs are good.

## 2016-09-30 ENCOUNTER — Other Ambulatory Visit: Payer: Self-pay | Admitting: Neurology

## 2016-09-30 DIAGNOSIS — R471 Dysarthria and anarthria: Secondary | ICD-10-CM

## 2016-10-04 ENCOUNTER — Telehealth: Payer: Self-pay | Admitting: *Deleted

## 2016-10-04 MED ORDER — NONFORMULARY OR COMPOUNDED ITEM: 2 refills | Status: DC

## 2016-10-04 NOTE — Telephone Encounter (Addendum)
-----   Message from Trula Slade, DPM sent at 09/29/2016  7:42 PM EST ----- Negative for fungus. However if he still would like treatment we can try the topical antifungal though [facility]. 10/04/2016-Informed pt of Dr.Wagoner's recommendation and he would like to try the topical. Orders for Onychomycosis Nail Lacquer faxed to [facility]

## 2016-10-08 ENCOUNTER — Ambulatory Visit
Admission: RE | Admit: 2016-10-08 | Discharge: 2016-10-08 | Disposition: A | Discharge status: Home / Self Care | Payer: Medicare Other | Source: Ambulatory Visit | Attending: Neurology | Admitting: Neurology

## 2016-10-08 DIAGNOSIS — Z8673 Personal history of transient ischemic attack (TIA), and cerebral infarction without residual deficits: Secondary | ICD-10-CM | POA: Insufficient documentation

## 2016-10-08 DIAGNOSIS — R471 Dysarthria and anarthria: Secondary | ICD-10-CM | POA: Diagnosis not present

## 2016-10-22 DIAGNOSIS — R471 Dysarthria and anarthria: Secondary | ICD-10-CM | POA: Diagnosis not present

## 2016-10-22 DIAGNOSIS — Z8673 Personal history of transient ischemic attack (TIA), and cerebral infarction without residual deficits: Secondary | ICD-10-CM | POA: Diagnosis not present

## 2016-11-02 ENCOUNTER — Telehealth: Payer: Self-pay | Admitting: Family Medicine

## 2016-11-02 DIAGNOSIS — E039 Hypothyroidism, unspecified: Secondary | ICD-10-CM | POA: Insufficient documentation

## 2016-11-02 DIAGNOSIS — E038 Other specified hypothyroidism: Secondary | ICD-10-CM

## 2016-11-02 NOTE — Telephone Encounter (Signed)
Tried calling patient. Left message to call back. 

## 2016-11-02 NOTE — Telephone Encounter (Signed)
Returning call, unable to reach [place].  Please call back.

## 2016-11-02 NOTE — Telephone Encounter (Signed)
Please advise patient it is time to check thyroid functions. Please print order and leave at front desk for patient to pick up.

## 2016-11-02 NOTE — Telephone Encounter (Signed)
Patient advised and agrees to have labs done. Lab slip printed and left up front for pick up.

## 2016-11-05 DIAGNOSIS — E039 Hypothyroidism, unspecified: Secondary | ICD-10-CM | POA: Diagnosis not present

## 2016-11-06 LAB — T4 AND TSH
T4 TOTAL: 7.5 ug/dL (ref 4.5–12.0)
TSH: 5.85 u[IU]/mL — AB (ref 0.450–4.500)

## 2016-12-22 DIAGNOSIS — E7800 Pure hypercholesterolemia, unspecified: Secondary | ICD-10-CM | POA: Diagnosis not present

## 2016-12-22 DIAGNOSIS — I10 Essential (primary) hypertension: Secondary | ICD-10-CM | POA: Diagnosis not present

## 2016-12-22 DIAGNOSIS — I25810 Atherosclerosis of coronary artery bypass graft(s) without angina pectoris: Secondary | ICD-10-CM | POA: Diagnosis not present

## 2017-01-05 DIAGNOSIS — Z8673 Personal history of transient ischemic attack (TIA), and cerebral infarction without residual deficits: Secondary | ICD-10-CM | POA: Diagnosis not present

## 2017-01-05 DIAGNOSIS — R413 Other amnesia: Secondary | ICD-10-CM | POA: Diagnosis not present

## 2017-03-29 DIAGNOSIS — L821 Other seborrheic keratosis: Secondary | ICD-10-CM | POA: Diagnosis not present

## 2017-03-29 DIAGNOSIS — C44619 Basal cell carcinoma of skin of left upper limb, including shoulder: Secondary | ICD-10-CM | POA: Diagnosis not present

## 2017-03-29 DIAGNOSIS — D1801 Hemangioma of skin and subcutaneous tissue: Secondary | ICD-10-CM | POA: Diagnosis not present

## 2017-03-29 DIAGNOSIS — L570 Actinic keratosis: Secondary | ICD-10-CM | POA: Diagnosis not present

## 2017-04-20 DIAGNOSIS — I2510 Atherosclerotic heart disease of native coronary artery without angina pectoris: Secondary | ICD-10-CM | POA: Diagnosis not present

## 2017-04-20 DIAGNOSIS — I25810 Atherosclerosis of coronary artery bypass graft(s) without angina pectoris: Secondary | ICD-10-CM | POA: Diagnosis not present

## 2017-04-20 DIAGNOSIS — Z8673 Personal history of transient ischemic attack (TIA), and cerebral infarction without residual deficits: Secondary | ICD-10-CM | POA: Diagnosis not present

## 2017-04-20 DIAGNOSIS — I10 Essential (primary) hypertension: Secondary | ICD-10-CM | POA: Diagnosis not present

## 2017-04-20 DIAGNOSIS — E7800 Pure hypercholesterolemia, unspecified: Secondary | ICD-10-CM | POA: Diagnosis not present

## 2017-05-06 DIAGNOSIS — C44619 Basal cell carcinoma of skin of left upper limb, including shoulder: Secondary | ICD-10-CM | POA: Diagnosis not present

## 2017-05-11 DIAGNOSIS — Z8673 Personal history of transient ischemic attack (TIA), and cerebral infarction without residual deficits: Secondary | ICD-10-CM | POA: Diagnosis not present

## 2017-05-11 DIAGNOSIS — R413 Other amnesia: Secondary | ICD-10-CM | POA: Diagnosis not present

## 2017-05-11 DIAGNOSIS — R471 Dysarthria and anarthria: Secondary | ICD-10-CM | POA: Diagnosis not present

## 2017-06-18 ENCOUNTER — Ambulatory Visit (INDEPENDENT_AMBULATORY_CARE_PROVIDER_SITE_OTHER): Payer: Medicare Other

## 2017-06-18 DIAGNOSIS — Z23 Encounter for immunization: Secondary | ICD-10-CM

## 2017-10-10 ENCOUNTER — Ambulatory Visit (INDEPENDENT_AMBULATORY_CARE_PROVIDER_SITE_OTHER): Payer: Medicare Other | Admitting: Family Medicine

## 2017-10-10 ENCOUNTER — Encounter: Payer: Self-pay | Admitting: Family Medicine

## 2017-10-10 VITALS — BP 130/90 | HR 53 | Temp 97.7°F | Resp 16 | Ht 67.5 in | Wt 169.0 lb

## 2017-10-10 DIAGNOSIS — R739 Hyperglycemia, unspecified: Secondary | ICD-10-CM | POA: Diagnosis not present

## 2017-10-10 DIAGNOSIS — E039 Hypothyroidism, unspecified: Secondary | ICD-10-CM

## 2017-10-10 DIAGNOSIS — E038 Other specified hypothyroidism: Secondary | ICD-10-CM

## 2017-10-10 DIAGNOSIS — I2510 Atherosclerotic heart disease of native coronary artery without angina pectoris: Secondary | ICD-10-CM | POA: Diagnosis not present

## 2017-10-10 DIAGNOSIS — K5900 Constipation, unspecified: Secondary | ICD-10-CM | POA: Diagnosis not present

## 2017-10-10 DIAGNOSIS — Z1211 Encounter for screening for malignant neoplasm of colon: Secondary | ICD-10-CM | POA: Diagnosis not present

## 2017-10-10 DIAGNOSIS — Z23 Encounter for immunization: Secondary | ICD-10-CM | POA: Diagnosis not present

## 2017-10-10 DIAGNOSIS — M7650 Patellar tendinitis, unspecified knee: Secondary | ICD-10-CM | POA: Insufficient documentation

## 2017-10-10 DIAGNOSIS — M6740 Ganglion, unspecified site: Secondary | ICD-10-CM | POA: Insufficient documentation

## 2017-10-10 DIAGNOSIS — N529 Male erectile dysfunction, unspecified: Secondary | ICD-10-CM

## 2017-10-10 DIAGNOSIS — E785 Hyperlipidemia, unspecified: Secondary | ICD-10-CM | POA: Diagnosis not present

## 2017-10-10 DIAGNOSIS — M6530 Trigger finger, unspecified finger: Secondary | ICD-10-CM | POA: Insufficient documentation

## 2017-10-10 DIAGNOSIS — Z125 Encounter for screening for malignant neoplasm of prostate: Secondary | ICD-10-CM

## 2017-10-10 DIAGNOSIS — Z0000 Encounter for general adult medical examination without abnormal findings: Secondary | ICD-10-CM

## 2017-10-10 NOTE — Progress Notes (Signed)
Patient: Timothy Carson, Male    DOB: [birth year], 72 y.o.   MRN: [id number] Visit Date: 10/10/2017  Today's Provider: Lelon Huh, MD   Chief Complaint  Patient presents with  . Annual Exam  . Coronary Artery Disease  . Hypothyroidism  . Hyperlipidemia  . Medicare Wellness   Subjective:    Annual wellness visit Timothy Carson is a 72 y.o. male. He feels fairly well. He reports exercising 5 times a week. He reports he is sleeping well. He is living part time in [place] with his son and planning on moving there full time when he sells his house in [place]  -----------------------------------------------------------  Complete Physical Exam   Lipid/Cholesterol, Follow-up:   Last seen for this 1 years ago.  Management since that visit includes; labs checked, no changes.  Last Lipid Panel:    Component Value Date/Time   CHOL 131 09/28/2016 0823   TRIG 85 09/28/2016 0823   HDL 57 09/28/2016 0823   CHOLHDL 2.3 09/28/2016 0823   LDLCALC 57 09/28/2016 0823    He reports good compliance with treatment. He is not having side effects.   Wt Readings from Last 3 Encounters:  09/27/16 164 lb (74.4 kg)  11/20/15 170 lb (77.1 kg)  09/11/13 188 lb (85.3 kg)    ------------------------------------------------------------------------  Coronary artery disease involving native coronary artery of native heart without angina pectoris Patient reports he has an appointment to see Dr. Josefa Half next month. He denies any chest pains, palpitations, or shorntess of breath.   Subclinical Hypothyroidism From 09/27/2016-labs checked, no changes. Energy level is good. Walks every day. No palpations. He does get a constipated frequently.  Lab Results  Component Value Date   TSH 5.850 (H) 11/05/2016     Review of Systems  Constitutional: Negative for appetite change, chills, fatigue and fever.  HENT: Negative for congestion, ear pain, hearing loss, nosebleeds and trouble swallowing.     Eyes: Negative for pain and visual disturbance.  Respiratory: Negative for cough, chest tightness and shortness of breath.   Cardiovascular: Negative for chest pain, palpitations and leg swelling.  Gastrointestinal: Positive for constipation. Negative for abdominal pain, blood in stool, diarrhea, nausea and vomiting.  Endocrine: Negative for polydipsia, polyphagia and polyuria.  Genitourinary: Positive for enuresis. Negative for dysuria and flank pain.  Musculoskeletal: Negative for arthralgias, back pain, joint swelling, myalgias and neck stiffness.  Skin: Negative for color change, rash and wound.  Neurological: Negative for dizziness, tremors, seizures, speech difficulty, weakness, light-headedness and headaches.  Psychiatric/Behavioral: Negative for behavioral problems, confusion, decreased concentration, dysphoric mood and sleep disturbance. The patient is not nervous/anxious.   All other systems reviewed and are negative.   Social History   Socioeconomic History  . Marital status: Married    Spouse name: Not on file  . Number of children: Not on file  . Years of education: Not on file  . Highest education level: Not on file  Social Needs  . Financial resource strain: Not on file  . Food insecurity - worry: Not on file  . Food insecurity - inability: Not on file  . Transportation needs - medical: Not on file  . Transportation needs - non-medical: Not on file  Occupational History  . Occupation: Retired  Tobacco Use  . Smoking status: Never Smoker  . Smokeless tobacco: Never Used  Substance and Sexual Activity  . Alcohol use: No  . Drug use: No  . Sexual activity: Not on file  Other Topics Concern  . Not on file  Social History Narrative  . Not on file    Past Medical History:  Diagnosis Date  . Depression   . ED (erectile dysfunction)   . History of measles   . History of mumps   . History of shingles   . History of TIA (transient ischemic attack) 2007  .  Stroke [facility]) 03/2016     Patient Active Problem List   Diagnosis Date Noted  . Acquired trigger finger 10/10/2017  . Patellar tendonitis 10/10/2017  . Ganglion of tendon sheath 10/10/2017  . Subclinical hypothyroidism 11/02/2016  . Hyperglycemia 11/20/2015  . History of TIA (transient ischemic attack) 11/18/2015  . CAD (coronary artery disease) 11/18/2015  . ED (erectile dysfunction) of organic origin 11/18/2015  . HLD (hyperlipidemia) 05/28/2014  . Atherosclerosis of autologous vein coronary artery bypass graft 05/28/2014    Past Surgical History:  Procedure Laterality Date  . CARDIAC SURGERY    . COLONOSCOPY  01/2008   No polyps; Diverticulosis  . CORONARY ARTERY BYPASS GRAFT  03/02/2001   Two:  LIMA to LAD to [facility]  . SHOULDER SURGERY Bilateral     His family history includes Heart disease in his mother.      Current Outpatient Medications:  .  aspirin 81 MG tablet, Take 81 mg by mouth daily., Disp: , Rfl:  .  ezetimibe-simvastatin (VYTORIN) 10-20 MG per tablet, Take 1 tablet by mouth daily., Disp: , Rfl:  .  NONFORMULARY OR COMPOUNDED ITEM, [facility]: Onychomycosis Nail Lacquer - Fluconazole 2%, Terbinabine 1%, apply to affected area daily., Disp: 120 each, Rfl: 2 .  sildenafil (VIAGRA) 100 MG tablet, Take 0.5-1 tablets by mouth as needed., Disp: , Rfl:   Patient Care Team: Birdie Sons, MD as PCP - General (Family Medicine) Isaias Cowman, MD as Consulting Physician (Cardiology)     Objective:   Vitals: BP 130/90 (BP Location: Left Arm, Patient Position: Sitting, Cuff Size: Normal)   Pulse (!) 53   Temp 97.7 F (36.5 C) (Oral)   Resp 16   Ht 5' 7.5" (1.715 m)   Wt 169 lb (76.7 kg)   SpO2 98% Comment: room air  BMI 26.08 kg/m   Physical Exam   General Appearance:    Alert, cooperative, no distress, appears stated age  Head:    Normocephalic, without obvious abnormality, atraumatic  Eyes:    PERRL, conjunctiva/corneas clear, EOM's intact,  fundi    benign, both eyes       Ears:    Normal TM's and external ear canals, both ears  Nose:   Nares normal, septum midline, mucosa normal, no drainage   or sinus tenderness  Throat:   Lips, mucosa, and tongue normal; teeth and gums normal  Neck:   Supple, symmetrical, trachea midline, no adenopathy;       thyroid:  No enlargement/tenderness/nodules; no carotid   bruit or JVD  Back:     Symmetric, no curvature, ROM normal, no CVA tenderness  Lungs:     Clear to auscultation bilaterally, respirations unlabored  Chest wall:    No tenderness or deformity  Heart:    Regular rate and rhythm, S1 and S2 normal, no murmur, rub   or gallop  Abdomen:     Soft, non-tender, bowel sounds active all four quadrants,    no masses, no organomegaly  Genitalia:    deferred  Rectal:    deferred  Extremities:   Extremities normal, atraumatic, no cyanosis  or edema  Pulses:   2+ and symmetric all extremities  Skin:   Skin color, texture, turgor normal, no rashes or lesions  Lymph nodes:   Cervical, supraclavicular, and axillary nodes normal  Neurologic:   CNII-XII intact. Normal strength, sensation and reflexes      throughout    Activities of Daily Living In your present state of health, do you have any difficulty performing the following activities: 10/10/2017  Hearing? N  Vision? N  Difficulty concentrating or making decisions? Y  Walking or climbing stairs? N  Dressing or bathing? N  Doing errands, shopping? N  Some recent data might be hidden    Fall Risk Assessment Fall Risk  10/10/2017 09/27/2016 11/20/2015  Falls in the past year? No No No     Depression Screen PHQ 2/9 Scores 10/10/2017 09/27/2016 11/20/2015  PHQ - 2 Score 0 0 0  PHQ- 9 Score 0 - -    Cognitive Testing - 6-CIT  Correct? Score   What year is it? yes 0 0 or 4  What month is it? yes 0 0 or 3  Memorize:    Pia Mau,  43,  [address]  [place],      What time is it? (within 1 hour) yes 0 0 or 3  Count backwards from 20  yes 0 0, 2, or 4  Name the months of the year yes 0 0, 2, or 4  Repeat name & address above yes 0 0, 2, 4, 6, 8, or 10       TOTAL SCORE  0/28   Interpretation:  Normal  Normal (0-7) Abnormal (8-28)    Audit-C Alcohol Use Screening  Question Answer Points  How often do you have alcoholic drink? never 0  On days you do drink alcohol, how many drinks do you typically consume? n/a 0  How oftey will you drink 6 or more in a total? never 0  Total Score:  0   A score of 3 or more in women, and 4 or more in men indicates increased risk for alcohol abuse, EXCEPT if all of the points are from question 1.   Current Exercise Habits: Home exercise routine, Type of exercise: walking, Time (Minutes): 30, Frequency (Times/Week): 5, Weekly Exercise (Minutes/Week): 150 Exercise limited by: None identified   Assessment & Plan:     Annual Wellness Visit  Reviewed patient's Family Medical History Reviewed and updated list of patient's medical providers Assessment of cognitive impairment was done Assessed patient's functional ability Established a written schedule for health screening [place] Completed and Reviewed  Exercise Activities and Dietary recommendations Goals    None      Immunization History  Administered Date(s) Administered  . Influenza, High Dose Seasonal PF 06/18/2015, 06/24/2016, 06/18/2017  . Pneumococcal Conjugate-13 06/18/2015  . Pneumococcal Polysaccharide-23 05/30/2013  . Tdap 08/17/2011  . Zoster 05/30/2013    Health Maintenance  Topic Date Due  . COLONOSCOPY  07/03/2018  . TETANUS/TDAP  08/16/2021  . INFLUENZA VACCINE  Completed  . Hepatitis C Screening  Completed  . PNA vac Low Risk Adult  Completed     Discussed health benefits of physical activity, and encouraged him to engage in regular exercise appropriate for his age and condition.      ------------------------------------------------------------------------------------------------------------  1. Annual physical exam Doing well, normal exam.   2. Medicare annual wellness visit, subsequent Doing well, no concerns.   3. Coronary artery disease involving native coronary artery of  native heart without angina pectoris Asymptomatic. Compliant with medication.  Continue aggressive risk factor modification.  Follow up Dr. Saralyn Pilar as scheduled.   4. Subclinical hypothyroidism May be contributing to constipation.  - TSH  5. . Hyperlipidemia, unspecified hyperlipidemia type Doing well with current dose of Vytorin.  - Lipid panel - Comprehensive metabolic panel  7. Hyperglycemia  - Hemoglobin A1c  8. Need for shingles vaccine Recommend he check with pharmacist regarding singrix.   9. Prostate cancer screening  - PSA  10. Constipation, unspecified constipation type Recommend OTC metamucil daily.   11. Colon cancer screening  - Cologuard   Lelon Huh, MD  [facility]

## 2017-10-10 NOTE — Patient Instructions (Addendum)
The CDC recommends two doses of Shingrix (the shingles vaccine) separated by 2 to 6 months for adults age 71 years and older. I recommend checking with your insurance plan regarding coverage for this vaccine.    You should take a dose of powdered metamucil every day to regular bowel movements.    Preventive Care 84 Years and Older, Male Preventive care refers to lifestyle choices and visits with your health care provider that can promote health and wellness. What does preventive care include?  A yearly physical exam. This is also called an annual well check.  Dental exams once or twice a year.  Routine eye exams. Ask your health care provider how often you should have your eyes checked.  Personal lifestyle choices, including: ? Daily care of your teeth and gums. ? Regular physical activity. ? Eating a healthy diet. ? Avoiding tobacco and drug use. ? Limiting alcohol use. ? Practicing safe sex. ? Taking low doses of aspirin every day. ? Taking vitamin and mineral supplements as recommended by your health care provider. What happens during an annual well check? The services and screenings done by your health care provider during your annual well check will depend on your age, overall health, lifestyle risk factors, and family history of disease. Counseling Your health care provider may ask you questions about your:  Alcohol use.  Tobacco use.  Drug use.  Emotional well-being.  Home and relationship well-being.  Sexual activity.  Eating habits.  History of falls.  Memory and ability to understand (cognition).  Work and work Statistician.  Screening You may have the following tests or measurements:  Height, weight, and BMI.  Blood pressure.  Lipid and cholesterol levels. These may be checked every 5 years, or more frequently if you are over 25 years old.  Skin check.  Lung cancer screening. You may have this screening every year starting at age 45 if you have  a 30-pack-year history of smoking and currently smoke or have quit within the past 15 years.  Fecal occult blood test (FOBT) of the stool. You may have this test every year starting at age 23.  Flexible sigmoidoscopy or colonoscopy. You may have a sigmoidoscopy every 5 years or a colonoscopy every 10 years starting at age 53.  Prostate cancer screening. Recommendations will vary depending on your family history and other risks.  Hepatitis C blood test.  Hepatitis B blood test.  Sexually transmitted disease (STD) testing.  Diabetes screening. This is done by checking your blood sugar (glucose) after you have not eaten for a while (fasting). You may have this done every 1-3 years.  Abdominal aortic aneurysm (AAA) screening. You may need this if you are a current or former smoker.  Osteoporosis. You may be screened starting at age 2 if you are at high risk.  Talk with your health care provider about your test results, treatment options, and if necessary, the need for more tests. Vaccines Your health care provider may recommend certain vaccines, such as:  Influenza vaccine. This is recommended every year.  Tetanus, diphtheria, and acellular pertussis (Tdap, Td) vaccine. You may need a Td booster every 10 years.  Varicella vaccine. You may need this if you have not been vaccinated.  Zoster vaccine. You may need this after age 24.  Measles, mumps, and rubella (MMR) vaccine. You may need at least one dose of MMR if you were born in 1957 or later. You may also need a second dose.  Pneumococcal 13-valent conjugate (  PCV13) vaccine. One dose is recommended after age 41.  Pneumococcal polysaccharide (PPSV23) vaccine. One dose is recommended after age 50.  Meningococcal vaccine. You may need this if you have certain conditions.  Hepatitis A vaccine. You may need this if you have certain conditions or if you travel or work in places where you may be exposed to hepatitis A.  Hepatitis B  vaccine. You may need this if you have certain conditions or if you travel or work in places where you may be exposed to hepatitis B.  Haemophilus influenzae type b (Hib) vaccine. You may need this if you have certain risk factors.  Talk to your health care provider about which screenings and vaccines you need and how often you need them. This information is not intended to replace advice given to you by your health care provider. Make sure you discuss any questions you have with your health care provider. Document Released: 09/19/2015 Document Revised: 05/12/2016 Document Reviewed: 06/24/2015 Elsevier Interactive Patient Education  [employer].

## 2017-10-11 LAB — LIPID PANEL
CHOLESTEROL TOTAL: 137 mg/dL (ref 100–199)
Chol/HDL Ratio: 2.3 ratio (ref 0.0–5.0)
HDL: 59 mg/dL (ref >39)
LDL CALC: 55 mg/dL (ref 0–99)
TRIGLYCERIDES: 117 mg/dL (ref 0–149)
VLDL Cholesterol Cal: 23 mg/dL (ref 5–40)

## 2017-10-11 LAB — PSA: PROSTATE SPECIFIC AG, SERUM: 0.9 ng/mL (ref 0.0–4.0)

## 2017-10-11 LAB — COMPREHENSIVE METABOLIC PANEL
A/G RATIO: 1.8 (ref 1.2–2.2)
ALBUMIN: 4.2 g/dL (ref 3.5–4.8)
ALT: 25 IU/L (ref 0–44)
AST: 27 IU/L (ref 0–40)
Alkaline Phosphatase: 67 IU/L (ref 39–117)
BUN / CREAT RATIO: 12 (ref 10–24)
BUN: 12 mg/dL (ref 8–27)
Bilirubin Total: 0.8 mg/dL (ref 0.0–1.2)
CALCIUM: 9.1 mg/dL (ref 8.6–10.2)
CO2: 22 mmol/L (ref 20–29)
CREATININE: 1.03 mg/dL (ref 0.76–1.27)
Chloride: 106 mmol/L (ref 96–106)
GFR, EST AFRICAN AMERICAN: 85 mL/min/{1.73_m2} (ref >59)
GFR, EST NON AFRICAN AMERICAN: 73 mL/min/{1.73_m2} (ref >59)
GLOBULIN, TOTAL: 2.4 g/dL (ref 1.5–4.5)
Glucose: 105 mg/dL — ABNORMAL HIGH (ref 65–99)
POTASSIUM: 3.9 mmol/L (ref 3.5–5.2)
SODIUM: 143 mmol/L (ref 134–144)
Total Protein: 6.6 g/dL (ref 6.0–8.5)

## 2017-10-11 LAB — HEMOGLOBIN A1C
Est. average glucose Bld gHb Est-mCnc: 123 mg/dL
Hgb A1c MFr Bld: 5.9 % — ABNORMAL HIGH (ref 4.8–5.6)

## 2017-10-11 LAB — TSH: TSH: 6.31 u[IU]/mL — ABNORMAL HIGH (ref 0.450–4.500)

## 2017-10-18 ENCOUNTER — Telehealth: Payer: Self-pay | Admitting: Family Medicine

## 2017-10-18 NOTE — Telephone Encounter (Signed)
Order for cologuard faxed to [facility] °

## 2017-10-21 DIAGNOSIS — L570 Actinic keratosis: Secondary | ICD-10-CM | POA: Diagnosis not present

## 2017-10-21 DIAGNOSIS — L821 Other seborrheic keratosis: Secondary | ICD-10-CM | POA: Diagnosis not present

## 2017-10-21 DIAGNOSIS — Z85828 Personal history of other malignant neoplasm of skin: Secondary | ICD-10-CM | POA: Diagnosis not present

## 2017-10-23 DIAGNOSIS — Z1211 Encounter for screening for malignant neoplasm of colon: Secondary | ICD-10-CM | POA: Diagnosis not present

## 2017-10-23 LAB — COLOGUARD: Cologuard: NEGATIVE

## 2017-10-26 DIAGNOSIS — I10 Essential (primary) hypertension: Secondary | ICD-10-CM | POA: Diagnosis not present

## 2017-10-26 DIAGNOSIS — I25810 Atherosclerosis of coronary artery bypass graft(s) without angina pectoris: Secondary | ICD-10-CM | POA: Diagnosis not present

## 2017-10-26 DIAGNOSIS — E785 Hyperlipidemia, unspecified: Secondary | ICD-10-CM | POA: Diagnosis not present

## 2017-10-26 DIAGNOSIS — Z8673 Personal history of transient ischemic attack (TIA), and cerebral infarction without residual deficits: Secondary | ICD-10-CM | POA: Diagnosis not present

## 2017-11-09 DIAGNOSIS — I69328 Other speech and language deficits following cerebral infarction: Secondary | ICD-10-CM | POA: Diagnosis not present

## 2017-11-09 DIAGNOSIS — R413 Other amnesia: Secondary | ICD-10-CM | POA: Diagnosis not present

## 2017-11-09 DIAGNOSIS — Z8673 Personal history of transient ischemic attack (TIA), and cerebral infarction without residual deficits: Secondary | ICD-10-CM | POA: Diagnosis not present

## 2018-04-21 DIAGNOSIS — L821 Other seborrheic keratosis: Secondary | ICD-10-CM | POA: Diagnosis not present

## 2018-04-21 DIAGNOSIS — L820 Inflamed seborrheic keratosis: Secondary | ICD-10-CM | POA: Diagnosis not present

## 2018-04-21 DIAGNOSIS — Z85828 Personal history of other malignant neoplasm of skin: Secondary | ICD-10-CM | POA: Diagnosis not present

## 2018-04-21 DIAGNOSIS — L570 Actinic keratosis: Secondary | ICD-10-CM | POA: Diagnosis not present

## 2018-04-26 DIAGNOSIS — E785 Hyperlipidemia, unspecified: Secondary | ICD-10-CM | POA: Diagnosis not present

## 2018-04-26 DIAGNOSIS — Z8673 Personal history of transient ischemic attack (TIA), and cerebral infarction without residual deficits: Secondary | ICD-10-CM | POA: Diagnosis not present

## 2018-04-26 DIAGNOSIS — I10 Essential (primary) hypertension: Secondary | ICD-10-CM | POA: Diagnosis not present

## 2018-04-26 DIAGNOSIS — I25810 Atherosclerosis of coronary artery bypass graft(s) without angina pectoris: Secondary | ICD-10-CM | POA: Diagnosis not present

## 2018-06-29 ENCOUNTER — Ambulatory Visit (INDEPENDENT_AMBULATORY_CARE_PROVIDER_SITE_OTHER): Payer: Medicare Other

## 2018-06-29 DIAGNOSIS — Z23 Encounter for immunization: Secondary | ICD-10-CM | POA: Diagnosis not present

## 2018-10-11 ENCOUNTER — Ambulatory Visit (INDEPENDENT_AMBULATORY_CARE_PROVIDER_SITE_OTHER): Payer: Medicare Other | Admitting: Family Medicine

## 2018-10-11 ENCOUNTER — Encounter: Payer: Self-pay | Admitting: Family Medicine

## 2018-10-11 VITALS — BP 128/84 | HR 62 | Temp 98.7°F | Resp 16 | Ht 67.0 in | Wt 163.0 lb

## 2018-10-11 DIAGNOSIS — I2510 Atherosclerotic heart disease of native coronary artery without angina pectoris: Secondary | ICD-10-CM | POA: Diagnosis not present

## 2018-10-11 DIAGNOSIS — R739 Hyperglycemia, unspecified: Secondary | ICD-10-CM | POA: Diagnosis not present

## 2018-10-11 DIAGNOSIS — Z0000 Encounter for general adult medical examination without abnormal findings: Secondary | ICD-10-CM | POA: Diagnosis not present

## 2018-10-11 DIAGNOSIS — E039 Hypothyroidism, unspecified: Secondary | ICD-10-CM | POA: Diagnosis not present

## 2018-10-11 DIAGNOSIS — E038 Other specified hypothyroidism: Secondary | ICD-10-CM

## 2018-10-11 DIAGNOSIS — Z125 Encounter for screening for malignant neoplasm of prostate: Secondary | ICD-10-CM

## 2018-10-11 DIAGNOSIS — E785 Hyperlipidemia, unspecified: Secondary | ICD-10-CM

## 2018-10-11 DIAGNOSIS — R221 Localized swelling, mass and lump, neck: Secondary | ICD-10-CM

## 2018-10-11 NOTE — Progress Notes (Signed)
Patient: Timothy Carson, Male    DOB: 1946-04-30, 73 y.o.   MRN: [id number] Visit Date: 10/11/2018  Today's Provider: Lelon Huh, MD   Chief Complaint  Patient presents with  . Medicare Wellness  . Coronary Artery Disease  . Hypothyroidism  . Hyperlipidemia  . Hyperglycemia   Subjective:     Annual wellness visit Timothy Carson is a 73 y.o. male. He feels fairly well. He reports exercising daily. He reports he is sleeping fairly well.  -----------------------------------------------------------  Follow up for CAD:  The patient was last seen for this 1 years ago. Changes made at last visit include none. Patient advised to follow up with Dr. Saralyn Pilar as scheduled.  He reports good compliance with treatment. He feels that condition is stable. He is not having side effects.   ------------------------------------------------------------------------------------  Follow up for Hypothyroidism:  The patient was last seen for this 1 years ago. Changes made at last visit include none.  He reports good compliance with treatment. He feels that condition is stable. He is not having side effects.   ------------------------------------------------------------------------------------  Lipid/Cholesterol, Follow-up:   Last seen for this1 years ago.  Management changes since that visit include none. . Last Lipid Panel:    Component Value Date/Time   CHOL 137 10/10/2017 0951   TRIG 117 10/10/2017 0951   HDL 59 10/10/2017 0951   CHOLHDL 2.3 10/10/2017 0951   LDLCALC 55 10/10/2017 0951    Risk factors for vascular disease include hypercholesterolemia  He reports good compliance with treatment. He is not having side effects.  Current symptoms include none and have been stable. Weight trend: fluctuating a bit Prior visit with dietician: no Current diet: in general, a "healthy" diet   Current exercise: walking  Wt Readings from Last 3 Encounters:  10/11/18 163  lb (73.9 kg)  10/10/17 169 lb (76.7 kg)  09/27/16 164 lb (74.4 kg)    -------------------------------------------------------------------  Hyperglycemia, Follow-up:   Lab Results  Component Value Date   HGBA1C 5.9 (H) 10/10/2017   HGBA1C 5.9 (H) 11/20/2015   GLUCOSE 105 (H) 10/10/2017   GLUCOSE 117 (H) 09/28/2016   GLUCOSE 116 (H) 11/20/2015    Last seen for for this 1 years ago.  Management since then includes advising patient to avoid sweets and starchy foods. Current symptoms include none and have been stable.  Weight trend: fluctuating a bit Prior visit with dietician: no Current diet: in general, a "healthy" diet   Current exercise: walking  Pertinent Labs:    Component Value Date/Time   CHOL 137 10/10/2017 0951   TRIG 117 10/10/2017 0951   CHOLHDL 2.3 10/10/2017 0951   CREATININE 1.03 10/10/2017 0951    Wt Readings from Last 3 Encounters:  10/11/18 163 lb (73.9 kg)  10/10/17 169 lb (76.7 kg)  09/27/16 164 lb (74.4 kg)     Review of Systems  Constitutional: Negative for appetite change, chills, fatigue and fever.  HENT: Positive for tinnitus. Negative for congestion, ear pain, hearing loss, nosebleeds and trouble swallowing.   Eyes: Negative for pain and visual disturbance.  Respiratory: Negative for cough, chest tightness and shortness of breath.   Cardiovascular: Negative for chest pain, palpitations and leg swelling.  Gastrointestinal: Negative for abdominal pain, blood in stool, constipation, diarrhea, nausea and vomiting.  Endocrine: Negative for polydipsia, polyphagia and polyuria.  Genitourinary: Positive for frequency. Negative for dysuria and flank pain.  Musculoskeletal: Positive for arthralgias (finger pain). Negative for back pain, joint swelling,  myalgias and neck stiffness.  Skin: Negative for color change, rash and wound.  Neurological: Positive for light-headedness. Negative for dizziness, tremors, seizures, speech difficulty, weakness and  headaches.  Psychiatric/Behavioral: Negative for behavioral problems, confusion, decreased concentration, dysphoric mood and sleep disturbance. The patient is not nervous/anxious.   All other systems reviewed and are negative.   Social History   Socioeconomic History  . Marital status: Widowed    Spouse name: Not on file  . Number of children: 2  . Years of education: Not on file  . Highest education level: Not on file  Occupational History  . Occupation: Retired  Scientific laboratory technician  . Financial resource strain: Not on file  . Food insecurity:    Worry: Not on file    Inability: Not on file  . Transportation needs:    Medical: Not on file    Non-medical: Not on file  Tobacco Use  . Smoking status: Never Smoker  . Smokeless tobacco: Never Used  Substance and Sexual Activity  . Alcohol use: No  . Drug use: No  . Sexual activity: Not on file  Lifestyle  . Physical activity:    Days per week: Not on file    Minutes per session: Not on file  . Stress: Not on file  Relationships  . Social connections:    Talks on phone: Not on file    Gets together: Not on file    Attends religious service: Not on file    Active member of club or organization: Not on file    Attends meetings of clubs or organizations: Not on file    Relationship status: Not on file  . Intimate partner violence:    Fear of current or ex partner: Not on file    Emotionally abused: Not on file    Physically abused: Not on file    Forced sexual activity: Not on file  Other Topics Concern  . Not on file  Social History Narrative  . Not on file    Past Medical History:  Diagnosis Date  . Depression   . ED (erectile dysfunction)   . History of measles   . History of mumps   . History of shingles   . History of TIA (transient ischemic attack) 2007  . Stroke [facility] 03/2016     Patient Active Problem List   Diagnosis Date Noted  . Acquired trigger finger 10/10/2017  . Patellar tendonitis 10/10/2017  .  Ganglion of tendon sheath 10/10/2017  . Subclinical hypothyroidism 11/02/2016  . Hyperglycemia 11/20/2015  . History of TIA (transient ischemic attack) 11/18/2015  . CAD (coronary artery disease) 11/18/2015  . ED (erectile dysfunction) of organic origin 11/18/2015  . HLD (hyperlipidemia) 05/28/2014  . Atherosclerosis of autologous vein coronary artery bypass graft 05/28/2014    Past Surgical History:  Procedure Laterality Date  . CARDIAC SURGERY    . COLONOSCOPY  01/2008   No polyps; Diverticulosis  . CORONARY ARTERY BYPASS GRAFT  03/02/2001   Two:  LIMA to LAD to [facility]  . SHOULDER SURGERY Bilateral     His family history includes Heart disease in his mother.   Current Outpatient Medications:  .  aspirin 325 MG EC tablet, Take 325 mg by mouth daily., Disp: , Rfl:  .  ezetimibe-simvastatin (VYTORIN) 10-20 MG per tablet, Take 1 tablet by mouth daily., Disp: , Rfl:  .  Multiple Vitamins-Minerals (CENTRUM SILVER PO), Take 1 capsule by mouth daily., Disp: , Rfl:  .  Omega-3  Fatty Acids (FISH OIL) 600 MG CAPS, Take 1 capsule by mouth daily., Disp: , Rfl:   Patient Care Team: Birdie Sons, MD as PCP - General (Family Medicine) Isaias Cowman, MD as Consulting Physician (Cardiology)    Objective:    Vitals: BP 128/84 (BP Location: Left Arm, Patient Position: Sitting, Cuff Size: Normal)   Pulse 62   Temp 98.7 F (37.1 C) (Oral)   Resp 16   Ht 5\' 7"  (1.702 m)   Wt 163 lb (73.9 kg)   SpO2 99% Comment: room air  BMI 25.53 kg/m     Activities of Daily Living In your present state of health, do you have any difficulty performing the following activities: 10/11/2018  Hearing? N  Vision? N  Difficulty concentrating or making decisions? N  Walking or climbing stairs? N  Dressing or bathing? N  Doing errands, shopping? N  Some recent data might be hidden    Fall Risk Assessment Fall Risk  10/11/2018 10/10/2017 09/27/2016 11/20/2015  Falls in the past year? 0 No No No    Number falls in past yr: 0 - - -  Injury with Fall? 0 - - -  Follow up Falls evaluation completed - - -     Depression Screen PHQ 2/9 Scores 10/11/2018 10/10/2017 09/27/2016 11/20/2015  PHQ - 2 Score 0 0 0 0  PHQ- 9 Score 1 0 - -     Cognitive Testing - 6-CIT  Correct? Score   What year is it? yes 0 0 or 4  What month is it? yes 0 0 or 3  Memorize:    Pia Mau,  43,  [place],      What time is it? (within 1 hour) yes 0 0 or 3  Count backwards from 20 yes 0 0, 2, or 4  Name the months of the year yes 0 0, 2, or 4  Repeat name & address above yes 0 0, 2, 4, 6, 8, or 10       TOTAL SCORE  0/28   Interpretation:  Normal  Normal (0-7) Abnormal (8-28)   No flowsheet data found.    Assessment & Plan:     Annual Wellness Visit  Reviewed patient's Family Medical History Reviewed and updated list of patient's medical providers Assessment of cognitive impairment was done Assessed patient's functional ability Established a written schedule for health screening [place] Completed and Reviewed  Exercise Activities and Dietary recommendations Goals   None     Immunization History  Administered Date(s) Administered  . Influenza, High Dose Seasonal PF 06/18/2015, 06/24/2016, 06/18/2017, 06/29/2018  . Pneumococcal Conjugate-13 06/18/2015  . Pneumococcal Polysaccharide-23 05/30/2013  . Tdap 08/17/2011  . Zoster 05/30/2013    Health Maintenance  Topic Date Due  . Fecal DNA (Cologuard)  10/29/2020  . TETANUS/TDAP  08/16/2021  . INFLUENZA VACCINE  Completed  . Hepatitis C Screening  Completed  . PNA vac Low Risk Adult  Completed     Discussed health benefits of physical activity, and encouraged him to engage in regular exercise appropriate for his age and condition.    ------------------------------------------------------------------------------------------------------------    Lelon Huh, MD  [place]

## 2018-10-11 NOTE — Progress Notes (Signed)
Patient: Timothy Carson, Male    DOB: Oct 12, 1945, 73 y.o.   MRN: [id number] Visit Date: 10/11/2018  Today's Provider: Lelon Huh, MD    Subjective:     Annual physical exam Timothy Carson is a 73 y.o. male who presents today for health maintenance and complete physical. He feels well. He reports exercising occasionall. He reports he is sleeping fairly well.  -----------------------------------------------------------------   Review of Systems  Constitutional: Negative for appetite change, chills, fatigue and fever.  HENT: Positive for tinnitus. Negative for congestion, ear pain, hearing loss, nosebleeds and trouble swallowing.   Eyes: Negative for pain and visual disturbance.  Respiratory: Negative for cough, chest tightness and shortness of breath.   Cardiovascular: Negative for chest pain, palpitations and leg swelling.  Gastrointestinal: Negative for abdominal pain, blood in stool, constipation, diarrhea, nausea and vomiting.  Endocrine: Negative for polydipsia, polyphagia and polyuria.  Genitourinary: Positive for frequency. Negative for dysuria and flank pain.  Musculoskeletal: Positive for arthralgias (finger pain). Negative for back pain, joint swelling, myalgias and neck stiffness.  Skin: Negative for color change, rash and wound.  Neurological: Positive for light-headedness. Negative for dizziness, tremors, seizures, speech difficulty, weakness and headaches.  Psychiatric/Behavioral: Negative for behavioral problems, confusion, decreased concentration, dysphoric mood and sleep disturbance. The patient is not nervous/anxious.   All other systems reviewed and are negative.  Social History      He  reports that he has never smoked. He has never used smokeless tobacco. He reports that he does not drink alcohol or use drugs.       Social History   Socioeconomic History  . Marital status: Widowed    Spouse name: Not on file  . Number of children: 2  . Years of  education: Not on file  . Highest education level: Not on file  Occupational History  . Occupation: Retired  Scientific laboratory technician  . Financial resource strain: Not on file  . Food insecurity:    Worry: Not on file    Inability: Not on file  . Transportation needs:    Medical: Not on file    Non-medical: Not on file  Tobacco Use  . Smoking status: Never Smoker  . Smokeless tobacco: Never Used  Substance and Sexual Activity  . Alcohol use: No  . Drug use: No  . Sexual activity: Not on file  Lifestyle  . Physical activity:    Days per week: Not on file    Minutes per session: Not on file  . Stress: Not on file  Relationships  . Social connections:    Talks on phone: Not on file    Gets together: Not on file    Attends religious service: Not on file    Active member of club or organization: Not on file    Attends meetings of clubs or organizations: Not on file    Relationship status: Not on file  Other Topics Concern  . Not on file  Social History Narrative  . Not on file    Past Medical History:  Diagnosis Date  . Depression   . ED (erectile dysfunction)   . History of measles   . History of mumps   . History of shingles   . History of TIA (transient ischemic attack) 2007  . Stroke [facility]) 03/2016     Patient Active Problem List   Diagnosis Date Noted  . Acquired trigger finger 10/10/2017  . Patellar tendonitis 10/10/2017  . Ganglion  of tendon sheath 10/10/2017  . Subclinical hypothyroidism 11/02/2016  . Hyperglycemia 11/20/2015  . History of TIA (transient ischemic attack) 11/18/2015  . CAD (coronary artery disease) 11/18/2015  . ED (erectile dysfunction) of organic origin 11/18/2015  . HLD (hyperlipidemia) 05/28/2014  . Atherosclerosis of autologous vein coronary artery bypass graft 05/28/2014    Past Surgical History:  Procedure Laterality Date  . CARDIAC SURGERY    . COLONOSCOPY  01/2008   No polyps; Diverticulosis  . CORONARY ARTERY BYPASS GRAFT   03/02/2001   Two:  LIMA to LAD to [facility]  . SHOULDER SURGERY Bilateral     Family History        Family Status  Relation Name Status  . Mother  Alive  . Father  Deceased       unsure of health issues- he left when patient was a child        His family history includes Heart disease in his mother.      No Known Allergies   Current Outpatient Medications:  .  aspirin 325 MG EC tablet, Take 325 mg by mouth daily., Disp: , Rfl:  .  ezetimibe-simvastatin (VYTORIN) 10-20 MG per tablet, Take 1 tablet by mouth daily., Disp: , Rfl:  .  Multiple Vitamins-Minerals (CENTRUM SILVER PO), Take 1 capsule by mouth daily., Disp: , Rfl:  .  Omega-3 Fatty Acids (FISH OIL) 600 MG CAPS, Take 1 capsule by mouth daily., Disp: , Rfl:    Patient Care Team: Birdie Sons, MD as PCP - General (Family Medicine) Isaias Cowman, MD as Consulting Physician (Cardiology) Baxter Kail, MD (Dermatology)    Objective:    Vitals: BP 128/84 (BP Location: Left Arm, Patient Position: Sitting, Cuff Size: Normal)   Pulse 62   Temp 98.7 F (37.1 C) (Oral)   Resp 16   Ht 5\' 7"  (1.702 m)   Wt 163 lb (73.9 kg)   SpO2 99% Comment: room air  BMI 25.53 kg/m    Vitals:   10/11/18 0916  BP: 128/84  Pulse: 62  Resp: 16  Temp: 98.7 F (37.1 C)  TempSrc: Oral  SpO2: 99%  Weight: 163 lb (73.9 kg)  Height: 5\' 7"  (1.702 m)     Physical Exam   General Appearance:    Alert, cooperative, no distress, appears stated age  Head:    Normocephalic, without obvious abnormality, atraumatic  Eyes:    PERRL, conjunctiva/corneas clear, EOM's intact, fundi    benign, both eyes       Ears:    Normal TM's and external ear canals, both ears  Nose:   Nares normal, septum midline, mucosa normal, no drainage   or sinus tenderness  Throat:   Lips, mucosa, and tongue normal; teeth and gums normal  Neck:   Supple, symmetrical, trachea midline. tender marble sized slightly firm mass left upper neck; not  Thyroid No  enlargement/tenderness/nodules; no carotid   bruit or JVD  Back:     Symmetric, no curvature, ROM normal, no CVA tenderness  Lungs:     Clear to auscultation bilaterally, respirations unlabored  Chest wall:    No tenderness or deformity  Heart:    Regular rate and rhythm, S1 and S2 normal, no murmur, rub   or gallop  Abdomen:     Soft, non-tender, bowel sounds active all four quadrants,    no masses, no organomegaly  Genitalia:    deferred  Rectal:    deferred  Extremities:   Extremities normal, atraumatic,  no cyanosis or edema  Pulses:   2+ and symmetric all extremities  Skin:   Skin color, texture, turgor normal, no rashes or lesions  Lymph nodes:   Cervical, supraclavicular, and axillary nodes normal  Neurologic:   CNII-XII intact. Normal strength, sensation and reflexes      throughout    Depression Screen PHQ 2/9 Scores 10/11/2018 10/10/2017 09/27/2016 11/20/2015  PHQ - 2 Score 0 0 0 0  PHQ- 9 Score 1 0 - -       Assessment & Plan:     Routine Health Maintenance and Physical Exam  Exercise Activities and Dietary recommendations Goals   None     Immunization History  Administered Date(s) Administered  . Influenza, High Dose Seasonal PF 06/18/2015, 06/24/2016, 06/18/2017, 06/29/2018  . Pneumococcal Conjugate-13 06/18/2015  . Pneumococcal Polysaccharide-23 05/30/2013  . Tdap 08/17/2011  . Zoster 05/30/2013    Health Maintenance  Topic Date Due  . Fecal DNA (Cologuard)  10/29/2020  . TETANUS/TDAP  08/16/2021  . INFLUENZA VACCINE  Completed  . Hepatitis C Screening  Completed  . PNA vac Low Risk Adult  Completed     Discussed health benefits of physical activity, and encouraged him to engage in regular exercise appropriate for his age and condition.    1. Annual physical exam Doing well. Discussed Shingrix vaccine  2. Coronary artery disease involving native coronary artery of native heart without angina pectoris Asymptomatic. Compliant with medication.   Continue aggressive risk factor modification.    3. Hyperlipidemia, unspecified hyperlipidemia type Doing well with Vytorin.  - Comprehensive metabolic panel - Lipid panel - CBC - TSH  4. Hyperglycemia Due to check - Hemoglobin A1c  5. Prostate cancer screening  - PSA  6. Subclinical hypothyroidism  - TSH  7. Localized swelling, mass and lump, neck  - US Soft Tissue Head/Neck; Future     Lelon Huh, MD  [facility]

## 2018-10-11 NOTE — Patient Instructions (Addendum)
.   Please review the attached list of medications and notify my office if there are any errors.   . Please bring all of your medications to every appointment so we can make sure that our medication list is the same as yours.    The CDC recommends two doses of Shingrix (the shingles vaccine) separated by 2 to 6 months for adults age 72 years and older. I recommend checking with your insurance plan regarding coverage for this vaccine.   

## 2018-10-12 ENCOUNTER — Telehealth: Payer: Self-pay

## 2018-10-12 LAB — PSA: Prostate Specific Ag, Serum: 1.3 ng/mL (ref 0.0–4.0)

## 2018-10-12 LAB — CBC
HEMATOCRIT: 41.5 % (ref 37.5–51.0)
HEMOGLOBIN: 14.4 g/dL (ref 13.0–17.7)
MCH: 33.2 pg — ABNORMAL HIGH (ref 26.6–33.0)
MCHC: 34.7 g/dL (ref 31.5–35.7)
MCV: 96 fL (ref 79–97)
Platelets: 192 10*3/uL (ref 150–450)
RBC: 4.34 x10E6/uL (ref 4.14–5.80)
RDW: 12.9 % (ref 11.6–15.4)
WBC: 5.3 10*3/uL (ref 3.4–10.8)

## 2018-10-12 LAB — COMPREHENSIVE METABOLIC PANEL
A/G RATIO: 1.7 (ref 1.2–2.2)
ALK PHOS: 68 IU/L (ref 39–117)
ALT: 42 IU/L (ref 0–44)
AST: 37 IU/L (ref 0–40)
Albumin: 4.4 g/dL (ref 3.7–4.7)
BUN/Creatinine Ratio: 12 (ref 10–24)
BUN: 12 mg/dL (ref 8–27)
Bilirubin Total: 1 mg/dL (ref 0.0–1.2)
CHLORIDE: 107 mmol/L — AB (ref 96–106)
CO2: 21 mmol/L (ref 20–29)
Calcium: 9.6 mg/dL (ref 8.6–10.2)
Creatinine, Ser: 1.03 mg/dL (ref 0.76–1.27)
GFR calc non Af Amer: 73 mL/min/{1.73_m2} (ref >59)
GFR, EST AFRICAN AMERICAN: 84 mL/min/{1.73_m2} (ref >59)
Globulin, Total: 2.6 g/dL (ref 1.5–4.5)
Glucose: 96 mg/dL (ref 65–99)
POTASSIUM: 4.1 mmol/L (ref 3.5–5.2)
Sodium: 144 mmol/L (ref 134–144)
Total Protein: 7 g/dL (ref 6.0–8.5)

## 2018-10-12 LAB — LIPID PANEL
CHOLESTEROL TOTAL: 140 mg/dL (ref 100–199)
Chol/HDL Ratio: 2.4 ratio (ref 0.0–5.0)
HDL: 58 mg/dL (ref >39)
LDL Calculated: 69 mg/dL (ref 0–99)
Triglycerides: 63 mg/dL (ref 0–149)
VLDL Cholesterol Cal: 13 mg/dL (ref 5–40)

## 2018-10-12 LAB — HEMOGLOBIN A1C
Est. average glucose Bld gHb Est-mCnc: 114 mg/dL
Hgb A1c MFr Bld: 5.6 % (ref 4.8–5.6)

## 2018-10-12 LAB — TSH: TSH: 5.68 u[IU]/mL — ABNORMAL HIGH (ref 0.450–4.500)

## 2018-10-12 NOTE — Telephone Encounter (Signed)
LMTCB 10/12/2018  Thanks,   -Laura  

## 2018-10-12 NOTE — Telephone Encounter (Signed)
Pt advised.   Thanks,   -Laura  

## 2018-10-12 NOTE — Telephone Encounter (Signed)
-----   Message from Birdie Sons, MD sent at 10/12/2018  7:41 AM EST ----- PSA, blood sugar, kidney functions, electrolytes and cholesterol are all normal. Continue current medications.  Check labs yearly.

## 2018-10-16 ENCOUNTER — Telehealth: Payer: Self-pay

## 2018-10-16 ENCOUNTER — Ambulatory Visit
Admission: RE | Admit: 2018-10-16 | Discharge: 2018-10-16 | Disposition: A | Discharge status: Home / Self Care | Payer: Medicare Other | Source: Ambulatory Visit | Attending: Family Medicine | Admitting: Family Medicine

## 2018-10-16 DIAGNOSIS — R930 Abnormal findings on diagnostic imaging of skull and head, not elsewhere classified: Secondary | ICD-10-CM | POA: Diagnosis not present

## 2018-10-16 DIAGNOSIS — R221 Localized swelling, mass and lump, neck: Secondary | ICD-10-CM | POA: Insufficient documentation

## 2018-10-16 NOTE — Telephone Encounter (Signed)
-----   Message from Birdie Sons, MD sent at 10/16/2018  4:33 PM EST ----- Ultrasound shows enlarged salivary gland, but is otherwise normal, nothing wrong with it.

## 2018-10-16 NOTE — Telephone Encounter (Signed)
LMTCB 10/16/2018  Thanks,   -Jennife Zaucha  

## 2018-10-18 NOTE — Telephone Encounter (Signed)
Pt advised.   Thanks,   -Laura  

## 2018-10-26 DIAGNOSIS — E785 Hyperlipidemia, unspecified: Secondary | ICD-10-CM | POA: Diagnosis not present

## 2018-10-26 DIAGNOSIS — I10 Essential (primary) hypertension: Secondary | ICD-10-CM | POA: Diagnosis not present

## 2018-10-26 DIAGNOSIS — R739 Hyperglycemia, unspecified: Secondary | ICD-10-CM | POA: Diagnosis not present

## 2018-10-26 DIAGNOSIS — I2510 Atherosclerotic heart disease of native coronary artery without angina pectoris: Secondary | ICD-10-CM | POA: Diagnosis not present

## 2019-01-01 DIAGNOSIS — G5601 Carpal tunnel syndrome, right upper limb: Secondary | ICD-10-CM | POA: Diagnosis not present

## 2019-01-15 DIAGNOSIS — M6530 Trigger finger, unspecified finger: Secondary | ICD-10-CM | POA: Diagnosis not present

## 2019-01-17 DIAGNOSIS — G5601 Carpal tunnel syndrome, right upper limb: Secondary | ICD-10-CM | POA: Diagnosis not present

## 2019-02-07 DIAGNOSIS — M6740 Ganglion, unspecified site: Secondary | ICD-10-CM | POA: Diagnosis not present

## 2019-02-07 DIAGNOSIS — M6530 Trigger finger, unspecified finger: Secondary | ICD-10-CM | POA: Diagnosis not present

## 2019-02-27 ENCOUNTER — Telehealth: Payer: Self-pay | Admitting: Family Medicine

## 2019-02-27 NOTE — Telephone Encounter (Signed)
Pt advised to call if he starts to have symptoms of Covid so Dr. Caryn Section can place an order to be tested.    Thanks,   -Mickel Baas

## 2019-02-27 NOTE — Telephone Encounter (Signed)
Pt is going to son's wedding this weekend and would like to know if he can get tested for COVID once he gets back.  Explained we are testing only if you have symptoms at this time.   Pt would like advice on what to do since he is the most vunerable person at the wedding.

## 2019-04-26 DIAGNOSIS — E785 Hyperlipidemia, unspecified: Secondary | ICD-10-CM | POA: Diagnosis not present

## 2019-04-26 DIAGNOSIS — I10 Essential (primary) hypertension: Secondary | ICD-10-CM | POA: Diagnosis not present

## 2019-04-26 DIAGNOSIS — R739 Hyperglycemia, unspecified: Secondary | ICD-10-CM | POA: Diagnosis not present

## 2019-04-26 DIAGNOSIS — I2510 Atherosclerotic heart disease of native coronary artery without angina pectoris: Secondary | ICD-10-CM | POA: Diagnosis not present

## 2019-09-12 DIAGNOSIS — H52223 Regular astigmatism, bilateral: Secondary | ICD-10-CM | POA: Diagnosis not present

## 2019-10-09 ENCOUNTER — Ambulatory Visit (INDEPENDENT_AMBULATORY_CARE_PROVIDER_SITE_OTHER): Payer: Medicare Other | Admitting: Family Medicine

## 2019-10-09 ENCOUNTER — Other Ambulatory Visit: Payer: Self-pay

## 2019-10-09 ENCOUNTER — Encounter: Payer: Self-pay | Admitting: Family Medicine

## 2019-10-09 VITALS — BP 140/90 | HR 55 | Temp 96.9°F | Ht 68.0 in | Wt 168.0 lb

## 2019-10-09 DIAGNOSIS — Z125 Encounter for screening for malignant neoplasm of prostate: Secondary | ICD-10-CM | POA: Diagnosis not present

## 2019-10-09 DIAGNOSIS — Z0000 Encounter for general adult medical examination without abnormal findings: Secondary | ICD-10-CM

## 2019-10-09 DIAGNOSIS — R030 Elevated blood-pressure reading, without diagnosis of hypertension: Secondary | ICD-10-CM | POA: Diagnosis not present

## 2019-10-09 DIAGNOSIS — R739 Hyperglycemia, unspecified: Secondary | ICD-10-CM | POA: Diagnosis not present

## 2019-10-09 DIAGNOSIS — E785 Hyperlipidemia, unspecified: Secondary | ICD-10-CM | POA: Diagnosis not present

## 2019-10-09 DIAGNOSIS — E038 Other specified hypothyroidism: Secondary | ICD-10-CM

## 2019-10-09 DIAGNOSIS — E039 Hypothyroidism, unspecified: Secondary | ICD-10-CM | POA: Diagnosis not present

## 2019-10-09 NOTE — Progress Notes (Signed)
Patient: Timothy Carson, Male    DOB: 02/06/1946, 74 y.o.   MRN: [id number] Visit Date: 10/09/2019  Today's Provider: Lelon Huh, MD   Chief Complaint  Patient presents with  . Medicare Wellness   Subjective:     Annual wellness visit Timothy Carson is a 75 y.o. male. He feels well. He reports exercising most days. He reports he is sleeping well.  -----------------------------------------------------------     Social History   Socioeconomic History  . Marital status: Widowed    Spouse name: Not on file  . Number of children: 2  . Years of education: Not on file  . Highest education level: Not on file  Occupational History  . Occupation: Retired  Tobacco Use  . Smoking status: Never Smoker  . Smokeless tobacco: Never Used  Substance and Sexual Activity  . Alcohol use: No  . Drug use: No  . Sexual activity: Not on file  Other Topics Concern  . Not on file  Social History Narrative  . Not on file   Social Determinants of Health   Financial Resource Strain:   . Difficulty of Paying Living Expenses: Not on file  Food Insecurity:   . Worried About Charity fundraiser in the Last Year: Not on file  . Ran Out of Food in the Last Year: Not on file  Transportation Needs:   . Lack of Transportation (Medical): Not on file  . Lack of Transportation (Non-Medical): Not on file  Physical Activity:   . Days of Exercise per Week: Not on file  . Minutes of Exercise per Session: Not on file  Stress:   . Feeling of Stress : Not on file  Social Connections:   . Frequency of Communication with Friends and Family: Not on file  . Frequency of Social Gatherings with Friends and Family: Not on file  . Attends Religious Services: Not on file  . Active Member of Clubs or Organizations: Not on file  . Attends Archivist Meetings: Not on file  . Marital Status: Not on file  Intimate Partner Violence:   . Fear of Current or Ex-Partner: Not on file  . Emotionally  Abused: Not on file  . Physically Abused: Not on file  . Sexually Abused: Not on file    Past Medical History:  Diagnosis Date  . Depression   . ED (erectile dysfunction)   . History of measles   . History of mumps   . History of shingles   . History of TIA (transient ischemic attack) 2007  . Stroke [facility]) 03/2016     Patient Active Problem List   Diagnosis Date Noted  . Acquired trigger finger 10/10/2017  . Patellar tendonitis 10/10/2017  . Ganglion of tendon sheath 10/10/2017  . Subclinical hypothyroidism 11/02/2016  . Hyperglycemia 11/20/2015  . History of TIA (transient ischemic attack) 11/18/2015  . CAD (coronary artery disease) 11/18/2015  . ED (erectile dysfunction) of organic origin 11/18/2015  . HLD (hyperlipidemia) 05/28/2014  . Atherosclerosis of autologous vein coronary artery bypass graft 05/28/2014    Past Surgical History:  Procedure Laterality Date  . CARDIAC SURGERY    . COLONOSCOPY  01/2008   No polyps; Diverticulosis  . CORONARY ARTERY BYPASS GRAFT  03/02/2001   Two:  LIMA to LAD to [facility]  . SHOULDER SURGERY Bilateral     His family history includes Heart disease in his mother.   Current Outpatient Medications:  .  aspirin 325  MG EC tablet, Take 325 mg by mouth daily., Disp: , Rfl:  .  ezetimibe-simvastatin (VYTORIN) 10-20 MG per tablet, Take 1 tablet by mouth daily., Disp: , Rfl:   Patient Care Team: Birdie Sons, MD as PCP - General (Family Medicine) Isaias Cowman, MD as Consulting Physician (Cardiology) Baxter Kail, MD (Dermatology)    Objective:      Activities of Daily Living In your present state of health, do you have any difficulty performing the following activities: 10/09/2019 10/11/2018  Hearing? N N  Vision? N N  Difficulty concentrating or making decisions? N N  Walking or climbing stairs? N N  Dressing or bathing? N N  Doing errands, shopping? N N  Some recent data might be hidden    Fall Risk Assessment Fall  Risk  10/09/2019 10/11/2018 10/10/2017 09/27/2016 11/20/2015  Falls in the past year? 0 0 No No No  Number falls in past yr: 0 0 - - -  Injury with Fall? 0 0 - - -  Follow up Falls evaluation completed Falls evaluation completed - - -     Depression Screen PHQ 2/9 Scores 10/09/2019 10/11/2018 10/10/2017 09/27/2016  PHQ - 2 Score 0 0 0 0  PHQ- 9 Score 0 1 0 -    No flowsheet data found.    Assessment & Plan:     Annual Wellness Visit  Reviewed patient's Family Medical History Reviewed and updated list of patient's medical providers Assessment of cognitive impairment was done Assessed patient's functional ability Established a written schedule for health screening [place] Completed and Reviewed  Exercise Activities and Dietary recommendations Goals   None     Immunization History  Administered Date(s) Administered  . Influenza, High Dose Seasonal PF 06/18/2015, 06/24/2016, 06/18/2017, 06/29/2018  . Influenza-Unspecified 06/07/2019  . Pneumococcal Conjugate-13 06/18/2015  . Pneumococcal Polysaccharide-23 05/30/2013  . Tdap 08/17/2011  . Zoster 05/30/2013    Health Maintenance  Topic Date Due  . Fecal DNA (Cologuard)  10/23/2020  . TETANUS/TDAP  08/16/2021  . INFLUENZA VACCINE  Completed  . Hepatitis C Screening  Completed  . PNA vac Low Risk Adult  Completed     Discussed health benefits of physical activity, and encouraged him to engage in regular exercise appropriate for his age and condition.    ------------------------------------------------------------------------------------------------------------    Lelon Huh, MD  [place]

## 2019-10-09 NOTE — Patient Instructions (Signed)
. Please review the attached list of medications and notify my office if there are any errors.   . Please bring all of your medications to every appointment so we can make sure that our medication list is the same as yours.    DASH Eating Plan DASH stands for "Dietary Approaches to Stop Hypertension." The DASH eating plan is a healthy eating plan that has been shown to reduce high blood pressure (hypertension). It may also reduce your risk for type 2 diabetes, heart disease, and stroke. The DASH eating plan may also help with weight loss. What are tips for following this plan?  General guidelines  Avoid eating more than 2,300 mg (milligrams) of salt (sodium) a day. If you have hypertension, you may need to reduce your sodium intake to 1,500 mg a day.  Limit alcohol intake to no more than 1 drink a day for nonpregnant women and 2 drinks a day for men. One drink equals 12 oz of beer, 5 oz of wine, or 1 oz of hard liquor.  Work with your health care provider to maintain a healthy body weight or to lose weight. Ask what an ideal weight is for you.  Get at least 30 minutes of exercise that causes your heart to beat faster (aerobic exercise) most days of the week. Activities may include walking, swimming, or biking.  Work with your health care provider or diet and nutrition specialist (dietitian) to adjust your eating plan to your individual calorie needs. Reading food labels   Check food labels for the amount of sodium per serving. Choose foods with less than 5 percent of the Daily Value of sodium. Generally, foods with less than 300 mg of sodium per serving fit into this eating plan.  To find whole grains, look for the word "whole" as the first word in the ingredient list. Shopping  Buy products labeled as "low-sodium" or "no salt added."  Buy fresh foods. Avoid canned foods and premade or frozen meals. Cooking  Avoid adding salt when cooking. Use salt-free seasonings or herbs instead  of table salt or sea salt. Check with your health care provider or pharmacist before using salt substitutes.  Do not fry foods. Cook foods using healthy methods such as baking, boiling, grilling, and broiling instead.  Cook with heart-healthy oils, such as olive, canola, soybean, or sunflower oil. Meal planning  Eat a balanced diet that includes: ? 5 or more servings of fruits and vegetables each day. At each meal, try to fill half of your plate with fruits and vegetables. ? Up to 6-8 servings of whole grains each day. ? Less than 6 oz of lean meat, poultry, or fish each day. A 3-oz serving of meat is about the same size as a deck of cards. One egg equals 1 oz. ? 2 servings of low-fat dairy each day. ? A serving of nuts, seeds, or beans 5 times each week. ? Heart-healthy fats. Healthy fats called Omega-3 fatty acids are found in foods such as flaxseeds and coldwater fish, like sardines, salmon, and mackerel.  Limit how much you eat of the following: ? Canned or prepackaged foods. ? Food that is high in trans fat, such as fried foods. ? Food that is high in saturated fat, such as fatty meat. ? Sweets, desserts, sugary drinks, and other foods with added sugar. ? Full-fat dairy products.  Do not salt foods before eating.  Try to eat at least 2 vegetarian meals each week.  Eat more home-cooked   food and less restaurant, buffet, and fast food.  When eating at a restaurant, ask that your food be prepared with less salt or no salt, if possible. What foods are recommended? The items listed may not be a complete list. Talk with your dietitian about what dietary choices are best for you. Grains Whole-grain or whole-wheat bread. Whole-grain or whole-wheat pasta. Brown rice. Oatmeal. Quinoa. Bulgur. Whole-grain and low-sodium cereals. Pita bread. Low-fat, low-sodium crackers. Whole-wheat flour tortillas. Vegetables Fresh or frozen vegetables (raw, steamed, roasted, or grilled). Low-sodium or  reduced-sodium tomato and vegetable juice. Low-sodium or reduced-sodium tomato sauce and tomato paste. Low-sodium or reduced-sodium canned vegetables. Fruits All fresh, dried, or frozen fruit. Canned fruit in natural juice (without added sugar). Meat and other protein foods Skinless chicken or turkey. Ground chicken or turkey. Pork with fat trimmed off. Fish and seafood. Egg whites. Dried beans, peas, or lentils. Unsalted nuts, nut butters, and seeds. Unsalted canned beans. Lean cuts of beef with fat trimmed off. Low-sodium, lean deli meat. Dairy Low-fat (1%) or fat-free (skim) milk. Fat-free, low-fat, or reduced-fat cheeses. Nonfat, low-sodium ricotta or cottage cheese. Low-fat or nonfat yogurt. Low-fat, low-sodium cheese. Fats and oils Soft margarine without trans fats. Vegetable oil. Low-fat, reduced-fat, or light mayonnaise and salad dressings (reduced-sodium). Canola, safflower, olive, soybean, and sunflower oils. Avocado. Seasoning and other foods Herbs. Spices. Seasoning mixes without salt. Unsalted popcorn and pretzels. Fat-free sweets. What foods are not recommended? The items listed may not be a complete list. Talk with your dietitian about what dietary choices are best for you. Grains Baked goods made with fat, such as croissants, muffins, or some breads. Dry pasta or rice meal packs. Vegetables Creamed or fried vegetables. Vegetables in a cheese sauce. Regular canned vegetables (not low-sodium or reduced-sodium). Regular canned tomato sauce and paste (not low-sodium or reduced-sodium). Regular tomato and vegetable juice (not low-sodium or reduced-sodium). Pickles. Olives. Fruits Canned fruit in a light or heavy syrup. Fried fruit. Fruit in cream or butter sauce. Meat and other protein foods Fatty cuts of meat. Ribs. Fried meat. Bacon. Sausage. Bologna and other processed lunch meats. Salami. Fatback. Hotdogs. Bratwurst. Salted nuts and seeds. Canned beans with added salt. Canned or  smoked fish. Whole eggs or egg yolks. Chicken or turkey with skin. Dairy Whole or 2% milk, cream, and half-and-half. Whole or full-fat cream cheese. Whole-fat or sweetened yogurt. Full-fat cheese. Nondairy creamers. Whipped toppings. Processed cheese and cheese spreads. Fats and oils Butter. Stick margarine. Lard. Shortening. Ghee. Bacon fat. Tropical oils, such as coconut, palm kernel, or palm oil. Seasoning and other foods Salted popcorn and pretzels. Onion salt, garlic salt, seasoned salt, table salt, and sea salt. Worcestershire sauce. Tartar sauce. Barbecue sauce. Teriyaki sauce. Soy sauce, including reduced-sodium. Steak sauce. Canned and packaged gravies. Fish sauce. Oyster sauce. Cocktail sauce. Horseradish that you find on the shelf. Ketchup. Mustard. Meat flavorings and tenderizers. Bouillon cubes. Hot sauce and Tabasco sauce. Premade or packaged marinades. Premade or packaged taco seasonings. Relishes. Regular salad dressings. Where to find more information:  National Heart, Lung, and Blood Institute: www.nhlbi.nih.gov  American Heart Association: www.heart.org Summary  The DASH eating plan is a healthy eating plan that has been shown to reduce high blood pressure (hypertension). It may also reduce your risk for type 2 diabetes, heart disease, and stroke.  With the DASH eating plan, you should limit salt (sodium) intake to 2,300 mg a day. If you have hypertension, you may need to reduce your sodium intake to 1,500 mg   a day.  When on the DASH eating plan, aim to eat more fresh fruits and vegetables, whole grains, lean proteins, low-fat dairy, and heart-healthy fats.  Work with your health care provider or diet and nutrition specialist (dietitian) to adjust your eating plan to your individual calorie needs. This information is not intended to replace advice given to you by your health care provider. Make sure you discuss any questions you have with your health care provider. Document  Revised: 08/05/2017 Document Reviewed: 08/16/2016 Elsevier Patient Education  2020 Elsevier Inc.  

## 2019-10-09 NOTE — Progress Notes (Signed)
Patient: Timothy Carson, Male    DOB: 1946/01/02, 74 y.o.   MRN: [id number] Visit Date: 10/09/2019  Today's Provider: Lelon Huh, MD   Chief Complaint  Patient presents with  . Annual Exam   Subjective:     Annual physical  Timothy Carson is a 74 y.o. male. He feels well. He reports exercising daily. He reports he is sleeping well.  -----------------------------------------------------------  Follow up for CAD:  The patient was last seen for this 1 years ago. Changes made at last visit include no changes. He is seen by Dr. Clayborn Bigness yearly and has follow up scheduled later this month.  He reports good compliance with treatment. He feels that condition is Unchanged. He is not having side effects.   ------------------------------------------------------------------------------------  Lipid/Cholesterol, Follow-up:   Last seen for this1 years ago.  Management changes since that visit include none. . Last Lipid Panel:    Component Value Date/Time   CHOL 140 10/11/2018 0956   TRIG 63 10/11/2018 0956   HDL 58 10/11/2018 0956   CHOLHDL 2.4 10/11/2018 0956   LDLCALC 69 10/11/2018 0956    Risk factors for vascular disease include hypercholesterolemia  He reports good compliance with treatment. He is not having side effects.  Current symptoms include none and have been stable. Weight trend: stable Prior visit with dietician: no Current diet: in general, a "healthy" diet   Current exercise: walking  Wt Readings from Last 3 Encounters:  10/09/19 168 lb (76.2 kg)  10/11/18 163 lb (73.9 kg)  10/10/17 169 lb (76.7 kg)    -------------------------------------------------------------------  Hyperglycemia, Follow-up:   Lab Results  Component Value Date   HGBA1C 5.6 10/11/2018   HGBA1C 5.9 (H) 10/10/2017   HGBA1C 5.9 (H) 11/20/2015   GLUCOSE 96 10/11/2018   GLUCOSE 105 (H) 10/10/2017   GLUCOSE 117 (H) 09/28/2016    Last seen for for this 1 years ago.    Management since then includes no changes. Current symptoms include none and have been stable.  Weight trend: stable Prior visit with dietician: no Current diet: in general, a "healthy" diet   Current exercise: walking  Pertinent Labs:    Component Value Date/Time   CHOL 140 10/11/2018 0956   TRIG 63 10/11/2018 0956   CHOLHDL 2.4 10/11/2018 0956   CREATININE 1.03 10/11/2018 0956    Wt Readings from Last 3 Encounters:  10/09/19 168 lb (76.2 kg)  10/11/18 163 lb (73.9 kg)  10/10/17 169 lb (76.7 kg)    Follow up for Hypothyroid:  The patient was last seen for this 1 years ago. Changes made at last visit include none.  He reports good compliance with treatment. He feels that condition is Unchanged. He is not having side effects.   ------------------------------------------------------------------------------------   Review of Systems  Constitutional: Negative for appetite change, chills, fatigue and fever.  HENT: Positive for tinnitus. Negative for congestion, ear pain, hearing loss, nosebleeds and trouble swallowing.   Eyes: Negative for pain and visual disturbance.  Respiratory: Negative for cough, chest tightness and shortness of breath.   Cardiovascular: Negative for chest pain, palpitations and leg swelling.  Gastrointestinal: Negative for abdominal pain, blood in stool, constipation, diarrhea, nausea and vomiting.  Endocrine: Negative for polydipsia, polyphagia and polyuria.  Genitourinary: Negative for dysuria and flank pain.  Musculoskeletal: Negative for arthralgias, back pain, joint swelling, myalgias and neck stiffness.  Skin: Negative for color change, rash and wound.  Neurological: Positive for speech difficulty. Negative for dizziness,  tremors, seizures, weakness, light-headedness and headaches.  Psychiatric/Behavioral: Negative for behavioral problems, confusion, decreased concentration, dysphoric mood and sleep disturbance. The patient is not  nervous/anxious.   All other systems reviewed and are negative.   Social History   Socioeconomic History  . Marital status: Widowed    Spouse name: Not on file  . Number of children: 2  . Years of education: Not on file  . Highest education level: Not on file  Occupational History  . Occupation: Retired  Tobacco Use  . Smoking status: Never Smoker  . Smokeless tobacco: Never Used  Substance and Sexual Activity  . Alcohol use: No  . Drug use: No  . Sexual activity: Not on file  Other Topics Concern  . Not on file  Social History Narrative  . Not on file   Social Determinants of Health   Financial Resource Strain:   . Difficulty of Paying Living Expenses: Not on file  Food Insecurity:   . Worried About Charity fundraiser in the Last Year: Not on file  . Ran Out of Food in the Last Year: Not on file  Transportation Needs:   . Lack of Transportation (Medical): Not on file  . Lack of Transportation (Non-Medical): Not on file  Physical Activity:   . Days of Exercise per Week: Not on file  . Minutes of Exercise per Session: Not on file  Stress:   . Feeling of Stress : Not on file  Social Connections:   . Frequency of Communication with Friends and Family: Not on file  . Frequency of Social Gatherings with Friends and Family: Not on file  . Attends Religious Services: Not on file  . Active Member of Clubs or Organizations: Not on file  . Attends Archivist Meetings: Not on file  . Marital Status: Not on file  Intimate Partner Violence:   . Fear of Current or Ex-Partner: Not on file  . Emotionally Abused: Not on file  . Physically Abused: Not on file  . Sexually Abused: Not on file    Past Medical History:  Diagnosis Date  . Depression   . ED (erectile dysfunction)   . History of measles   . History of mumps   . History of shingles   . History of TIA (transient ischemic attack) 2007  . Stroke [facility]) 03/2016     Patient Active Problem List    Diagnosis Date Noted  . Acquired trigger finger 10/10/2017  . Patellar tendonitis 10/10/2017  . Ganglion of tendon sheath 10/10/2017  . Subclinical hypothyroidism 11/02/2016  . Hyperglycemia 11/20/2015  . History of TIA (transient ischemic attack) 11/18/2015  . CAD (coronary artery disease) 11/18/2015  . ED (erectile dysfunction) of organic origin 11/18/2015  . HLD (hyperlipidemia) 05/28/2014  . Atherosclerosis of autologous vein coronary artery bypass graft 05/28/2014    Past Surgical History:  Procedure Laterality Date  . CARDIAC SURGERY    . COLONOSCOPY  01/2008   No polyps; Diverticulosis  . CORONARY ARTERY BYPASS GRAFT  03/02/2001   Two:  LIMA to LAD to [facility]  . SHOULDER SURGERY Bilateral     His family history includes Heart disease in his mother.   Current Outpatient Medications:  .  aspirin 325 MG EC tablet, Take 325 mg by mouth daily., Disp: , Rfl:  .  ezetimibe-simvastatin (VYTORIN) 10-20 MG per tablet, Take 1 tablet by mouth daily., Disp: , Rfl:   Patient Care Team: Birdie Sons, MD as PCP - General (  Family Medicine) Isaias Cowman, MD as Consulting Physician (Cardiology) Baxter Kail, MD (Dermatology)    Objective:    Vitals: BP 140/90 (BP Location: Right Arm, Cuff Size: Large)   Pulse (!) 55   Temp (!) 96.9 F (36.1 C) (Temporal)   Ht 5\' 8"  (1.727 m)   Wt 168 lb (76.2 kg)   SpO2 95% Comment: room  air  BMI 25.54 kg/m   Physical Exam  General Appearance:     Well developed, well nourished male. Alert, cooperative, in no acute distress, appears stated age  Head:    Normocephalic, without obvious abnormality, atraumatic  Eyes:    PERRL, conjunctiva/corneas clear, EOM's intact, fundi    benign, both eyes       Ears:    Normal TM's and external ear canals, both ears  Nose:   Nares normal, septum midline, mucosa normal, no drainage   or sinus tenderness  Throat:   Lips, mucosa, and tongue normal; teeth and gums normal  Neck:   Supple,  symmetrical, trachea midline, no adenopathy;       thyroid:  No enlargement/tenderness/nodules; no carotid   bruit or JVD  Back:     Symmetric, no curvature, ROM normal, no CVA tenderness  Lungs:     Clear to auscultation bilaterally, respirations unlabored  Chest wall:    No tenderness or deformity  Heart:    Bradycardic. Normal rhythm. No murmurs, rubs, or gallops.  S1 and S2 normal  Abdomen:     Soft, non-tender, bowel sounds active all four quadrants,    no masses, no organomegaly  Genitalia:    deferred  Rectal:    deferred  Extremities:   All extremities are intact. No cyanosis or edema  Pulses:   2+ and symmetric all extremities  Skin:   Skin color, texture, turgor normal, no rashes or lesions  Lymph nodes:   Cervical, supraclavicular, and axillary nodes normal  Neurologic:   CNII-XII intact. Normal strength, sensation and reflexes      throughout        Cognitive Testing - 6-CIT  Correct? Score   What year is it? yes 0 0 or 4  What month is it? yes 0 0 or 3  Memorize:    Pia Mau,  43,  [place],      What time is it? (within 1 hour) yes 0 0 or 3  Count backwards from 20 yes 0 0, 2, or 4  Name the months of the year yes 0 0, 2, or 4  Repeat name & address above yes 0 0, 2, 4, 6, 8, or 10       TOTAL SCORE  0/28   Interpretation:  Normal  Normal (0-7) Abnormal (8-28)   No flowsheet data found.    Assessment & Plan:     Annual Wellness Visit  Reviewed patient's Family Medical History Reviewed and updated list of patient's medical providers Assessment of cognitive impairment was done Assessed patient's functional ability Established a written schedule for health screening [place] Completed and Reviewed  Exercise Activities and Dietary recommendations Goals   None     Immunization History  Administered Date(s) Administered  . Influenza, High Dose Seasonal PF 06/18/2015, 06/24/2016, 06/18/2017, 06/29/2018  .  Influenza-Unspecified 06/07/2019  . Pneumococcal Conjugate-13 06/18/2015  . Pneumococcal Polysaccharide-23 05/30/2013  . Tdap 08/17/2011  . Zoster 05/30/2013    Health Maintenance  Topic Date Due  . Fecal DNA (Cologuard)  10/23/2020  .  TETANUS/TDAP  08/16/2021  . INFLUENZA VACCINE  Completed  . Hepatitis C Screening  Completed  . PNA vac Low Risk Adult  Completed     Discussed health benefits of physical activity, and encouraged him to engage in regular exercise appropriate for his age and condition.    ------------------------------------------------------------------------------------------------------------  1. Annual physical exam   2. Prostate cancer screening  - PSA  3. Elevated blood pressure reading He states he has been consuming more salt because his bp was running low last year. Advised to cut back to previous low sodium diet and see if bp improves at his follow up with Dr. Ubaldo Glassing later this month.   4. Hyperlipidemia, unspecified hyperlipidemia type Is doing well on current dose of simvastatin-ezetimibe  - Comprehensive metabolic panel - Lipid panel  5. Subclinical hypothyroidism  - TSH  6. Hyperglycemia  - Hemoglobin A1c   Lelon Huh, MD  [facility]

## 2019-10-10 LAB — LIPID PANEL
Chol/HDL Ratio: 2.5 ratio (ref 0.0–5.0)
Cholesterol, Total: 131 mg/dL (ref 100–199)
HDL: 52 mg/dL (ref >39)
LDL Chol Calc (NIH): 62 mg/dL (ref 0–99)
Triglycerides: 91 mg/dL (ref 0–149)
VLDL Cholesterol Cal: 17 mg/dL (ref 5–40)

## 2019-10-10 LAB — COMPREHENSIVE METABOLIC PANEL
ALT: 17 IU/L (ref 0–44)
AST: 21 IU/L (ref 0–40)
Albumin/Globulin Ratio: 1.5 (ref 1.2–2.2)
Albumin: 4.2 g/dL (ref 3.7–4.7)
Alkaline Phosphatase: 68 IU/L (ref 39–117)
BUN/Creatinine Ratio: 12 (ref 10–24)
BUN: 12 mg/dL (ref 8–27)
Bilirubin Total: 0.8 mg/dL (ref 0.0–1.2)
CO2: 22 mmol/L (ref 20–29)
Calcium: 9.3 mg/dL (ref 8.6–10.2)
Chloride: 108 mmol/L — ABNORMAL HIGH (ref 96–106)
Creatinine, Ser: 1 mg/dL (ref 0.76–1.27)
GFR calc Af Amer: 87 mL/min/{1.73_m2} (ref >59)
GFR calc non Af Amer: 75 mL/min/{1.73_m2} (ref >59)
Globulin, Total: 2.8 g/dL (ref 1.5–4.5)
Glucose: 89 mg/dL (ref 65–99)
Potassium: 4 mmol/L (ref 3.5–5.2)
Sodium: 144 mmol/L (ref 134–144)
Total Protein: 7 g/dL (ref 6.0–8.5)

## 2019-10-10 LAB — HEMOGLOBIN A1C
Est. average glucose Bld gHb Est-mCnc: 120 mg/dL
Hgb A1c MFr Bld: 5.8 % — ABNORMAL HIGH (ref 4.8–5.6)

## 2019-10-10 LAB — PSA: Prostate Specific Ag, Serum: 0.7 ng/mL (ref 0.0–4.0)

## 2019-10-10 LAB — TSH: TSH: 4.56 u[IU]/mL — ABNORMAL HIGH (ref 0.450–4.500)

## 2019-10-19 DIAGNOSIS — L570 Actinic keratosis: Secondary | ICD-10-CM | POA: Diagnosis not present

## 2019-10-19 DIAGNOSIS — D1801 Hemangioma of skin and subcutaneous tissue: Secondary | ICD-10-CM | POA: Diagnosis not present

## 2019-10-19 DIAGNOSIS — L821 Other seborrheic keratosis: Secondary | ICD-10-CM | POA: Diagnosis not present

## 2019-10-19 DIAGNOSIS — Z85828 Personal history of other malignant neoplasm of skin: Secondary | ICD-10-CM | POA: Diagnosis not present

## 2019-10-23 DIAGNOSIS — I25810 Atherosclerosis of coronary artery bypass graft(s) without angina pectoris: Secondary | ICD-10-CM | POA: Diagnosis not present

## 2019-10-23 DIAGNOSIS — E785 Hyperlipidemia, unspecified: Secondary | ICD-10-CM | POA: Diagnosis not present

## 2019-10-23 DIAGNOSIS — I2510 Atherosclerotic heart disease of native coronary artery without angina pectoris: Secondary | ICD-10-CM | POA: Diagnosis not present

## 2019-10-23 DIAGNOSIS — I10 Essential (primary) hypertension: Secondary | ICD-10-CM | POA: Diagnosis not present

## 2019-12-05 ENCOUNTER — Telehealth: Payer: Self-pay | Admitting: Family Medicine

## 2019-12-05 NOTE — Telephone Encounter (Signed)
Spoke with patient and explained the Wellness visit, he stated that he did not want to do it.  He stated he do this with his provider and declined.

## 2021-01-23 IMAGING — US US SOFT TISSUE HEAD/NECK
1 series · 10 of 10 positions shown · non-contrast
Comparison: None.

CLINICAL DATA: 72-year-old male with a palpable abnormality
detected in the left submandibular region on physical exam

EXAM:
ULTRASOUND OF HEAD/NECK SOFT TISSUES
TECHNIQUE: Ultrasound examination of the head and neck soft tissues was
performed in the area of clinical concern.

[Series 1: us soft tissue head/neck · 0.07mm/px · 10 acquisitions, 10 frames shown]
[im 1/10]
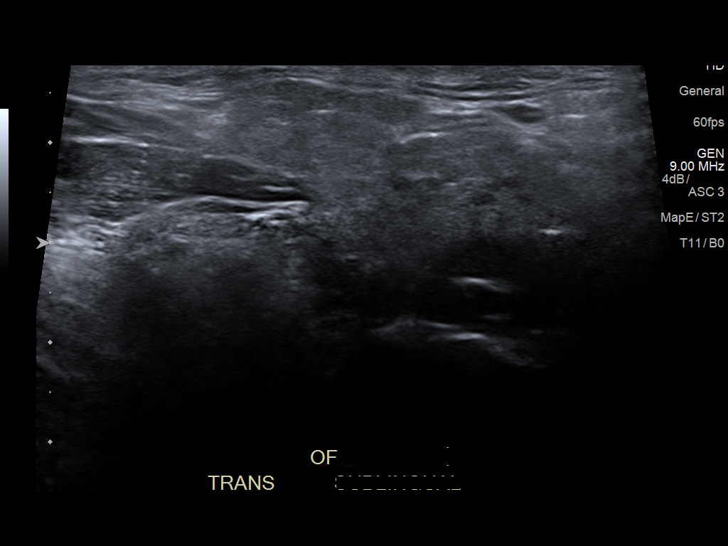
[im 2/10]
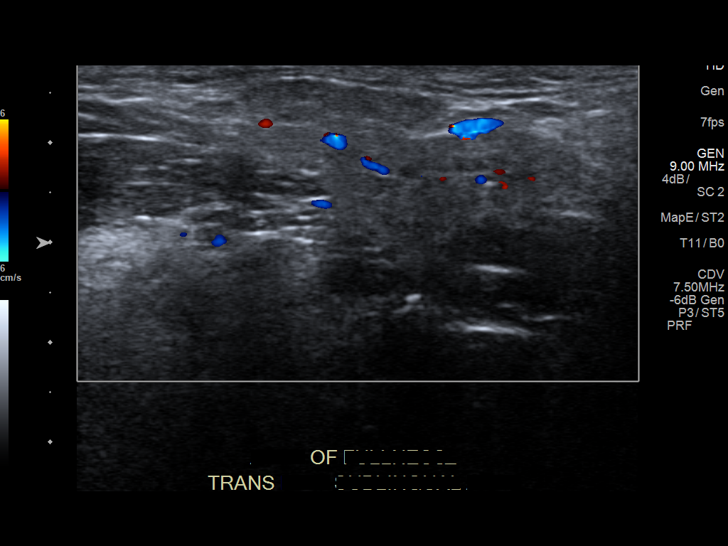
[im 3/10]
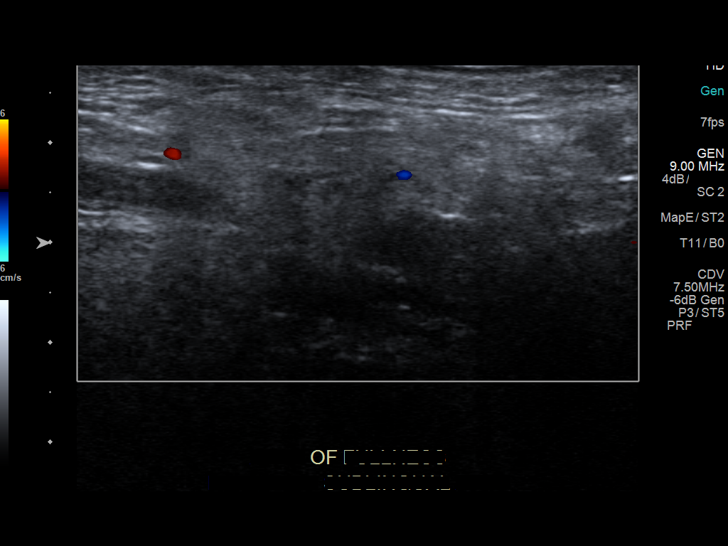
[im 4/10]
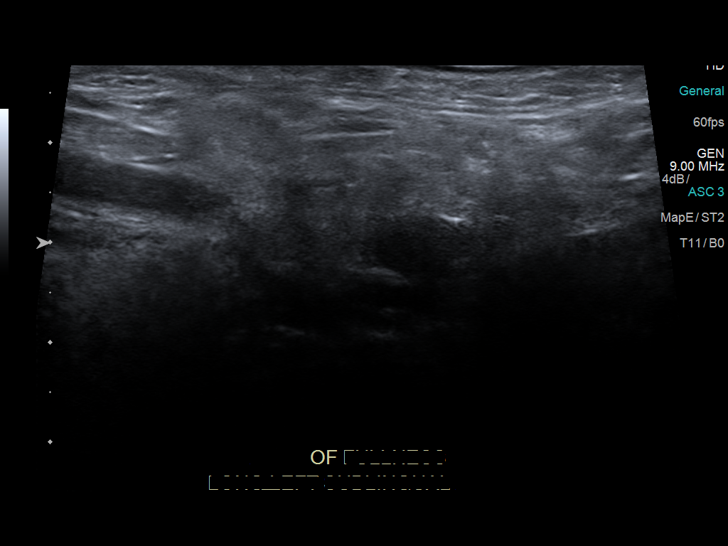
[im 5/10]
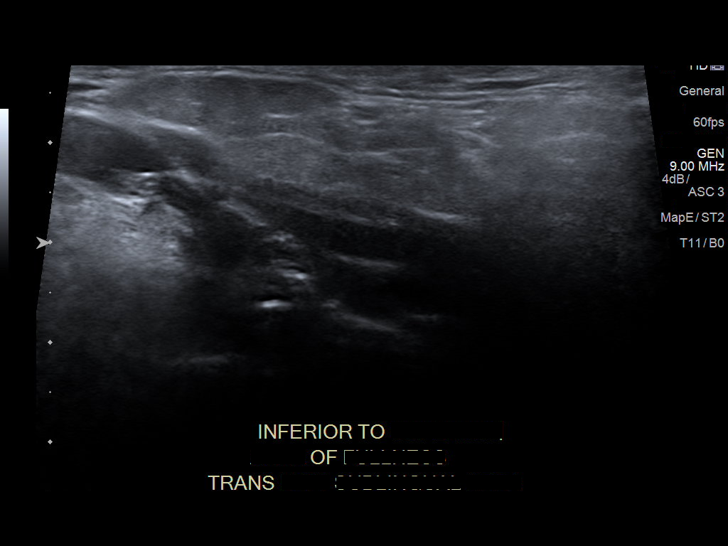
[im 6/10]
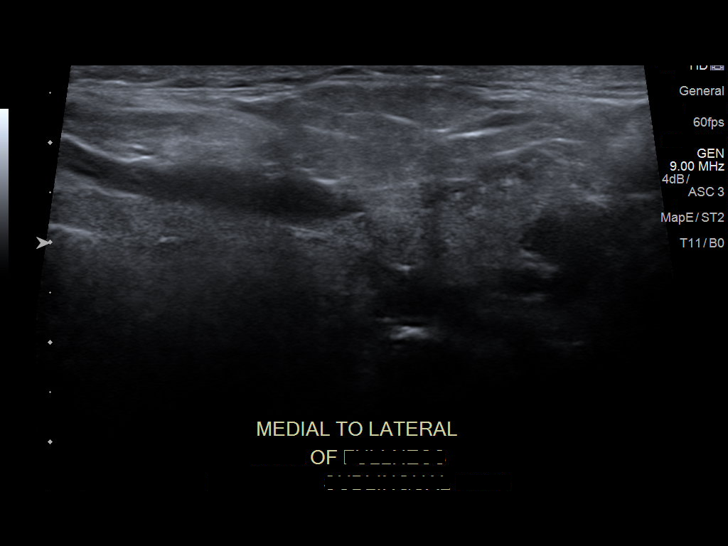
[im 7/10]
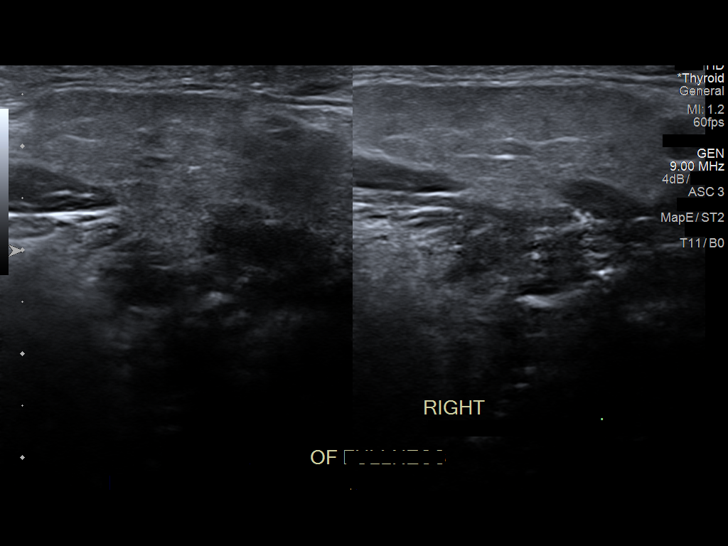
[im 8/10]
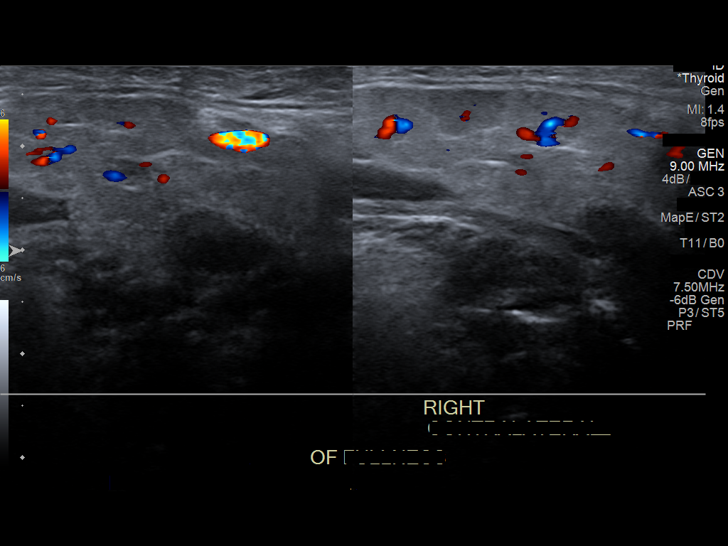
[im 9/10]
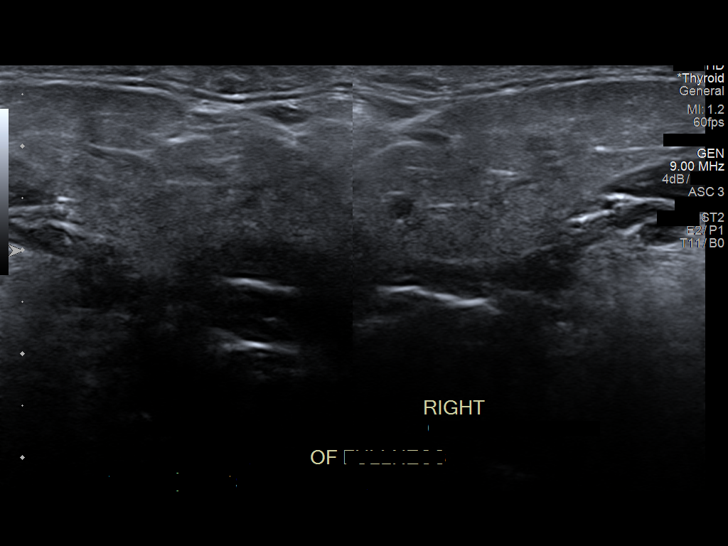
[im 10/10]
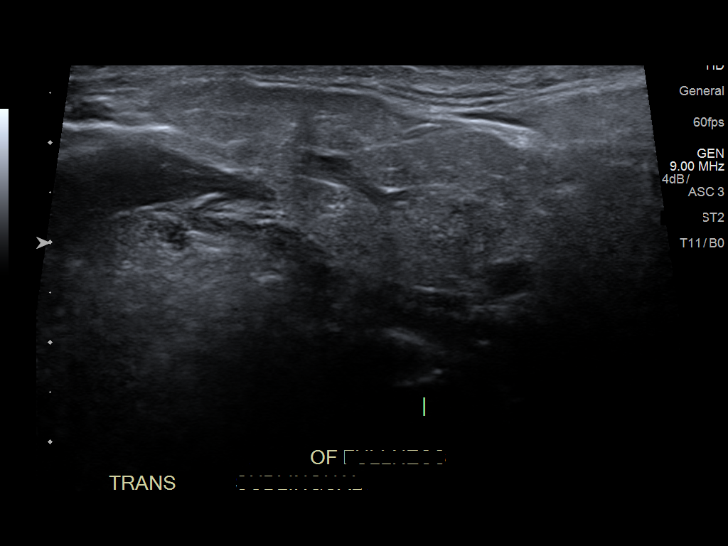

[10 of 10 positions shown; findings below may reference images not displayed]

FINDINGS: Sonographic interrogation of the region of clinical concern
demonstrates normal subcutaneous fat, vascularity and the left
submandibular gland. Imaging of the contralateral right side
demonstrates a similar appearing submandibular gland. No discrete
mass, cystic lesion or ductal ectasia.
IMPRESSION: The palpable abnormality corresponds with the left submandibular
gland. No evidence of mass, heterogeneity, cyst or ductal
dilatation.
